# Patient Record
Sex: Male | Born: 1985 | Race: White | Hispanic: No | Marital: Married | Smoking: Never smoker
Health system: Southern US, Community
[De-identification: ages and names within clinical notes are randomized; demographics above are authoritative.]

## PROBLEM LIST (undated history)

## (undated) DIAGNOSIS — I10 Essential (primary) hypertension: Secondary | ICD-10-CM

## (undated) DIAGNOSIS — M109 Gout, unspecified: Secondary | ICD-10-CM

## (undated) HISTORY — PX: OTHER SURGICAL HISTORY: SHX169

---

## 2010-09-04 ENCOUNTER — Ambulatory Visit: Payer: Self-pay | Admitting: Family Medicine

## 2012-07-16 ENCOUNTER — Ambulatory Visit: Payer: Self-pay | Admitting: Family Medicine

## 2012-08-10 ENCOUNTER — Ambulatory Visit: Payer: Self-pay | Admitting: Family Medicine

## 2013-12-30 ENCOUNTER — Ambulatory Visit: Payer: Self-pay | Admitting: Family Medicine

## 2014-01-10 ENCOUNTER — Emergency Department (HOSPITAL_COMMUNITY)
Admission: EM | Admit: 2014-01-10 | Discharge: 2014-01-10 | Disposition: A | Discharge status: Home / Self Care | Payer: 59 | Source: Home / Self Care | Attending: Family Medicine | Admitting: Family Medicine

## 2014-01-10 ENCOUNTER — Encounter (HOSPITAL_COMMUNITY): Payer: Self-pay | Admitting: *Deleted

## 2014-01-10 ENCOUNTER — Ambulatory Visit: Payer: Self-pay | Admitting: Family Medicine

## 2014-01-10 DIAGNOSIS — M10071 Idiopathic gout, right ankle and foot: Secondary | ICD-10-CM

## 2014-01-10 HISTORY — DX: Essential (primary) hypertension: I10

## 2014-01-10 HISTORY — DX: Gout, unspecified: M10.9

## 2014-01-10 LAB — URIC ACID: URIC ACID, SERUM: 7.5 mg/dL (ref 4.0–7.8)

## 2014-01-10 MED ORDER — INDOMETHACIN 50 MG PO CAPS: 50.0000 mg | ORAL_CAPSULE | Freq: Three times a day (TID) | ORAL | Status: DC

## 2014-01-10 MED ORDER — METHYLPREDNISOLONE ACETATE 80 MG/ML IJ SUSP
INTRAMUSCULAR | Status: AC
  Filled 2014-01-10: qty 1

## 2014-01-10 MED ORDER — METHYLPREDNISOLONE ACETATE PF 80 MG/ML IJ SUSP
80.0000 mg | Freq: Once | INTRAMUSCULAR | Status: AC
  Administered 2014-01-10: 80 mg via INTRAMUSCULAR

## 2014-01-10 MED ORDER — COLCHICINE 0.6 MG PO TABS: 0.6000 mg | ORAL_TABLET | Freq: Two times a day (BID) | ORAL | Status: AC

## 2014-01-10 NOTE — Discharge Instructions (Signed)
Use medicine as prescribed, have your doctor check blood test result, see your doctor as planned.

## 2014-01-10 NOTE — ED Provider Notes (Signed)
CSN: [id number]     Arrival date & time 01/10/14  1047 History   First MD Initiated Contact with Patient 01/10/14 1102     Chief Complaint  Patient presents with  . Foot Pain   (Consider location/radiation/quality/duration/timing/severity/associated sxs/prior Treatment) Patient is a 29 y.o. male presenting with lower extremity pain. The history is provided by the patient.  Foot Pain This is a chronic problem. The current episode started yesterday (longstanding gout hx, only taking uloric.). The problem has been gradually worsening. Pertinent negatives include no chest pain and no abdominal pain.    Past Medical History  Diagnosis Date  . Gout   . Hypertension    History reviewed. No pertinent past surgical history. History reviewed. No pertinent family history. History  Substance Use Topics  . Smoking status: Never Smoker   . Smokeless tobacco: Not on file  . Alcohol Use: No    Review of Systems  Constitutional: Negative.   Cardiovascular: Negative for chest pain.  Gastrointestinal: Negative for abdominal pain.  Musculoskeletal: Positive for joint swelling and gait problem. Negative for myalgias.    Allergies  Codeine  Home Medications   Prior to Admission medications   Medication Sig Start Date End Date Taking? Authorizing Provider  colchicine 0.6 MG tablet Take 1 tablet (0.6 mg total) by mouth 2 (two) times daily. 01/10/14   Linna Hoff, MD  indomethacin (INDOCIN) 50 MG capsule Take 1 capsule (50 mg total) by mouth 3 (three) times daily with meals. 01/10/14   Linna Hoff, MD  Meloxicam (MOBIC PO) Take by mouth.   Yes Historical Provider, MD   BP 145/93 mmHg  Pulse 96  Temp(Src) 97.7 F (36.5 C) (Oral)  Resp 16 Physical Exam  Constitutional: He is oriented to person, place, and time. He appears well-developed and well-nourished. He appears distressed.  Musculoskeletal: He exhibits tenderness.       Right ankle: He exhibits decreased range of motion, swelling and  deformity. He exhibits no ecchymosis and normal pulse. Tenderness. Lateral malleolus and medial malleolus tenderness found. No head of 5th metatarsal and no proximal fibula tenderness found. Achilles tendon normal.  Neurological: He is alert and oriented to person, place, and time.  Skin: Skin is warm and dry.  Nursing note and vitals reviewed.   ED Course  Procedures (including critical care time) Labs Review Labs Reviewed  URIC ACID    Imaging Review No results found.   MDM   1. Acute idiopathic gout of right ankle        Linna Hoff, MD 01/13/14 [phone]

## 2014-01-10 NOTE — ED Notes (Signed)
Pt  Reports  Pain  r  Foot      Since  last pm   He  Reports  History  Of   Gout  In  Past       denys  Any  specefic  Injury

## 2014-01-14 NOTE — ED Notes (Signed)
Uric acid 7.5.  Hx. Gout, treated with Colchicine and Indomethacin.  I asked Dr. Artis FlockKindl if pt. needs notified of his result.  He said no further action needed. Vassie MoselleYork, Bosco Paparella M 01/14/2014

## 2014-02-10 ENCOUNTER — Ambulatory Visit: Payer: Self-pay | Admitting: Family Medicine

## 2014-04-14 ENCOUNTER — Ambulatory Visit
Admit: 2014-04-14 | Disposition: A | Discharge status: Home / Self Care | Payer: Self-pay | Attending: Family Medicine | Admitting: Family Medicine

## 2014-05-21 ENCOUNTER — Encounter: Payer: 59 | Attending: Family Medicine | Admitting: Dietician

## 2014-05-21 ENCOUNTER — Encounter: Payer: Self-pay | Admitting: Dietician

## 2014-05-21 VITALS — Ht 67.0 in | Wt 250.7 lb

## 2014-05-21 DIAGNOSIS — E669 Obesity, unspecified: Secondary | ICD-10-CM | POA: Diagnosis present

## 2014-05-21 NOTE — Progress Notes (Signed)
Medical Nutrition Therapy: Visit time: 8-8:45am Assessment:  Diagnosis: obesity Past medical history: gout, hypertension Psychosocial issues/ stress concerns: Pt. had job change in past year as well as being new father (child just turned 1 yr.)  Current weight: 250.7  Height: 67 in Medications, supplements: see list Progress and evaluation:  Pt. In for 2nd MNT follow-up appointment. He weighs 1.2 lbs less than at previous visit, 01/2014.  He has continued with the positive diet changes he made after initial visit. He drinks no sugar sweetened beverages and controls portions especially of starchy foods. He has increased his vegetable intake which he states has been low all of his life. He is averaging 2 fruit servings/day and is emphasizing fruits high in potassium as was recommended. He is also taking a vitamin C supplement. He continues to eliminate red meats and limits sodium intake. He expressed frustration that his weight has plateaued  but agrees lack of physical activity is main factor. Pt. does report that his blood pressure has significantly improved and his uric acid has significantly decreased.  Physical activity: none. Since walking for exercise contributed to gout flare-up, suggested that [employer] contact [facility] to talk with exercise specialist about upper body exercises or equipment that would less likely put pressure on feet, knees and legs. Gave him a coupon allowing him to be assessed with no charge.  Nutrition Care Education: Gout: Reviewed dietary guidelines for gout with emphasis on a more plant based diet. Weight control: benefits of weight control, identifying healthy weight, determining reasonable weight goal, behavioral changes for weight loss Hypertension:  importance of controlling BP, identifying high sodium foods, identifying food sources of Calcium, potassium, magnesium Other lifestyle changes:  benefits of making changes, increasing motivation, readiness for  change, identifying habits that need to change, alcohol use, food and drug interactions  Nutritional Diagnosis: Low water intake based on dietary guidelines for gout. Low intake of fruits/vegetables although improved.  Intervention:  Pt. agreed to: [employer] assessment to see if upper body exercises would aggravate gout less than previously tried exercises. To use coupon for free week at fitness center. To continue to increase vegetables, trying roasting to decrease strong flavor. To be more conscious of water intake and increase to at least 10 cups per day.  Education Materials given:  Marland Kitchen. Has previously been given dietary guidelines for gout and hypertension. . Goals/ instructions  Learner/ who was taught:  . Patient   Level of understanding: Marland Kitchen. Verbalizes/ demonstrates competency  Learning barriers: . None  Willingness to learn/ readiness for change: . Eager, change in progress  Monitoring and Evaluation:  No follow-up scheduled. Pt. To call if desires further help with his diet/nutrition.

## 2014-05-21 NOTE — Patient Instructions (Signed)
Pt. Agreed to: Contact [facility] to schedule assessment to see if upper body exercises would aggravate gout less than previously tried exercises. To use coupon for free week at fitness center. To continue to increase vegetables, trying roasting to decrease strong flavor. To be more conscious of water intake and increase to at least 10 cups per day.

## 2014-10-13 ENCOUNTER — Other Ambulatory Visit: Payer: Self-pay | Admitting: Family Medicine

## 2015-12-22 DIAGNOSIS — Z885 Allergy status to narcotic agent status: Secondary | ICD-10-CM | POA: Diagnosis not present

## 2015-12-22 DIAGNOSIS — M545 Low back pain: Secondary | ICD-10-CM | POA: Diagnosis not present

## 2015-12-22 DIAGNOSIS — Z791 Long term (current) use of non-steroidal anti-inflammatories (NSAID): Secondary | ICD-10-CM | POA: Diagnosis not present

## 2015-12-22 DIAGNOSIS — Z6841 Body Mass Index (BMI) 40.0 and over, adult: Secondary | ICD-10-CM | POA: Diagnosis not present

## 2015-12-22 DIAGNOSIS — Z79899 Other long term (current) drug therapy: Secondary | ICD-10-CM | POA: Diagnosis not present

## 2015-12-22 DIAGNOSIS — Z7952 Long term (current) use of systemic steroids: Secondary | ICD-10-CM | POA: Diagnosis not present

## 2015-12-22 DIAGNOSIS — M1A09X1 Idiopathic chronic gout, multiple sites, with tophus (tophi): Secondary | ICD-10-CM | POA: Diagnosis not present

## 2015-12-22 DIAGNOSIS — I10 Essential (primary) hypertension: Secondary | ICD-10-CM | POA: Diagnosis not present

## 2016-02-18 DIAGNOSIS — M1A00X1 Idiopathic chronic gout, unspecified site, with tophus (tophi): Secondary | ICD-10-CM | POA: Diagnosis not present

## 2016-02-18 DIAGNOSIS — M25461 Effusion, right knee: Secondary | ICD-10-CM | POA: Diagnosis not present

## 2016-02-25 DIAGNOSIS — M2550 Pain in unspecified joint: Secondary | ICD-10-CM | POA: Diagnosis not present

## 2016-02-25 DIAGNOSIS — Z7952 Long term (current) use of systemic steroids: Secondary | ICD-10-CM | POA: Diagnosis not present

## 2016-02-25 DIAGNOSIS — M10072 Idiopathic gout, left ankle and foot: Secondary | ICD-10-CM | POA: Diagnosis not present

## 2016-02-25 DIAGNOSIS — E559 Vitamin D deficiency, unspecified: Secondary | ICD-10-CM | POA: Diagnosis not present

## 2016-02-25 DIAGNOSIS — I10 Essential (primary) hypertension: Secondary | ICD-10-CM | POA: Diagnosis not present

## 2016-02-25 DIAGNOSIS — M1A09X1 Idiopathic chronic gout, multiple sites, with tophus (tophi): Secondary | ICD-10-CM | POA: Diagnosis not present

## 2016-04-20 DIAGNOSIS — I10 Essential (primary) hypertension: Secondary | ICD-10-CM | POA: Diagnosis not present

## 2016-04-20 DIAGNOSIS — R195 Other fecal abnormalities: Secondary | ICD-10-CM | POA: Diagnosis not present

## 2016-04-20 DIAGNOSIS — T504X5A Adverse effect of drugs affecting uric acid metabolism, initial encounter: Secondary | ICD-10-CM | POA: Diagnosis not present

## 2016-04-20 DIAGNOSIS — Z885 Allergy status to narcotic agent status: Secondary | ICD-10-CM | POA: Diagnosis not present

## 2016-04-20 DIAGNOSIS — M1A09X1 Idiopathic chronic gout, multiple sites, with tophus (tophi): Secondary | ICD-10-CM | POA: Diagnosis not present

## 2016-04-20 DIAGNOSIS — Z09 Encounter for follow-up examination after completed treatment for conditions other than malignant neoplasm: Secondary | ICD-10-CM | POA: Diagnosis not present

## 2016-04-20 DIAGNOSIS — R109 Unspecified abdominal pain: Secondary | ICD-10-CM | POA: Diagnosis not present

## 2016-04-20 DIAGNOSIS — Z6841 Body Mass Index (BMI) 40.0 and over, adult: Secondary | ICD-10-CM | POA: Diagnosis not present

## 2016-04-20 DIAGNOSIS — Z79899 Other long term (current) drug therapy: Secondary | ICD-10-CM | POA: Diagnosis not present

## 2016-09-16 ENCOUNTER — Ambulatory Visit (INDEPENDENT_AMBULATORY_CARE_PROVIDER_SITE_OTHER): Payer: Self-pay | Admitting: Family

## 2016-09-16 DIAGNOSIS — Z79899 Other long term (current) drug therapy: Secondary | ICD-10-CM | POA: Diagnosis not present

## 2016-09-16 DIAGNOSIS — E559 Vitamin D deficiency, unspecified: Secondary | ICD-10-CM | POA: Diagnosis not present

## 2016-09-16 DIAGNOSIS — M10062 Idiopathic gout, left knee: Secondary | ICD-10-CM | POA: Diagnosis not present

## 2016-09-16 DIAGNOSIS — Z6841 Body Mass Index (BMI) 40.0 and over, adult: Secondary | ICD-10-CM | POA: Diagnosis not present

## 2016-09-16 DIAGNOSIS — M1A09X1 Idiopathic chronic gout, multiple sites, with tophus (tophi): Secondary | ICD-10-CM | POA: Diagnosis not present

## 2016-09-16 DIAGNOSIS — M25462 Effusion, left knee: Secondary | ICD-10-CM | POA: Diagnosis not present

## 2016-09-16 DIAGNOSIS — I10 Essential (primary) hypertension: Secondary | ICD-10-CM | POA: Diagnosis not present

## 2016-09-16 DIAGNOSIS — Z5181 Encounter for therapeutic drug level monitoring: Secondary | ICD-10-CM | POA: Diagnosis not present

## 2016-10-28 DIAGNOSIS — Z23 Encounter for immunization: Secondary | ICD-10-CM | POA: Diagnosis not present

## 2016-12-05 ENCOUNTER — Emergency Department: Payer: BLUE CROSS/BLUE SHIELD

## 2016-12-05 ENCOUNTER — Encounter: Payer: Self-pay | Admitting: Emergency Medicine

## 2016-12-05 DIAGNOSIS — I10 Essential (primary) hypertension: Secondary | ICD-10-CM | POA: Diagnosis present

## 2016-12-05 DIAGNOSIS — X500XXA Overexertion from strenuous movement or load, initial encounter: Secondary | ICD-10-CM | POA: Diagnosis not present

## 2016-12-05 DIAGNOSIS — Z885 Allergy status to narcotic agent status: Secondary | ICD-10-CM | POA: Diagnosis not present

## 2016-12-05 DIAGNOSIS — A419 Sepsis, unspecified organism: Principal | ICD-10-CM | POA: Diagnosis present

## 2016-12-05 DIAGNOSIS — M109 Gout, unspecified: Secondary | ICD-10-CM | POA: Diagnosis not present

## 2016-12-05 DIAGNOSIS — M7989 Other specified soft tissue disorders: Secondary | ICD-10-CM | POA: Diagnosis not present

## 2016-12-05 DIAGNOSIS — S63501A Unspecified sprain of right wrist, initial encounter: Secondary | ICD-10-CM | POA: Diagnosis present

## 2016-12-05 DIAGNOSIS — R220 Localized swelling, mass and lump, head: Secondary | ICD-10-CM | POA: Diagnosis not present

## 2016-12-05 DIAGNOSIS — E876 Hypokalemia: Secondary | ICD-10-CM | POA: Diagnosis present

## 2016-12-05 DIAGNOSIS — L03211 Cellulitis of face: Secondary | ICD-10-CM | POA: Diagnosis not present

## 2016-12-05 DIAGNOSIS — M25531 Pain in right wrist: Secondary | ICD-10-CM | POA: Diagnosis not present

## 2016-12-05 DIAGNOSIS — Z79899 Other long term (current) drug therapy: Secondary | ICD-10-CM | POA: Diagnosis not present

## 2016-12-05 DIAGNOSIS — S52514A Nondisplaced fracture of right radial styloid process, initial encounter for closed fracture: Secondary | ICD-10-CM | POA: Diagnosis not present

## 2016-12-05 DIAGNOSIS — S6991XA Unspecified injury of right wrist, hand and finger(s), initial encounter: Secondary | ICD-10-CM | POA: Diagnosis not present

## 2016-12-05 LAB — CBC
HCT: 43 % (ref 40.0–52.0)
Hemoglobin: 15 g/dL (ref 13.0–18.0)
MCH: 30.7 pg (ref 26.0–34.0)
MCHC: 34.8 g/dL (ref 32.0–36.0)
MCV: 88 fL (ref 80.0–100.0)
Platelets: 311 10*3/uL (ref 150–440)
RBC: 4.88 MIL/uL (ref 4.40–5.90)
RDW: 13.2 % (ref 11.5–14.5)
WBC: 13.2 10*3/uL — ABNORMAL HIGH (ref 3.8–10.6)

## 2016-12-05 LAB — BASIC METABOLIC PANEL
ANION GAP: 12 (ref 5–15)
BUN: 13 mg/dL (ref 6–20)
CHLORIDE: 102 mmol/L (ref 101–111)
CO2: 24 mmol/L (ref 22–32)
Calcium: 8.9 mg/dL (ref 8.9–10.3)
Creatinine, Ser: 1.1 mg/dL (ref 0.61–1.24)
GFR calc Af Amer: >60 mL/min (ref >60)
GFR calc non Af Amer: >60 mL/min (ref >60)
GLUCOSE: 116 mg/dL — AB (ref 65–99)
Potassium: 3.4 mmol/L — ABNORMAL LOW (ref 3.5–5.1)
Sodium: 138 mmol/L (ref 135–145)

## 2016-12-05 MED ORDER — IOPAMIDOL (ISOVUE-300) INJECTION 61%
75.0000 mL | Freq: Once | INTRAVENOUS | Status: AC | PRN
  Administered 2016-12-05: 75 mL via INTRAVENOUS

## 2016-12-05 MED ORDER — SODIUM CHLORIDE 0.9 % IV BOLUS (SEPSIS)
1000.0000 mL | Freq: Once | INTRAVENOUS | Status: AC
  Administered 2016-12-05: 1000 mL via INTRAVENOUS

## 2016-12-05 NOTE — ED Notes (Addendum)
Discussed with Dr. Sharma CovertNorman pt's presentation received verbal orders for blood work CBC, BM  and CT of face with contrast and 1L NS, acknowledge orders

## 2016-12-05 NOTE — ED Triage Notes (Signed)
Pt reports had a "pimple" to left nostril reports mild pain Saturday and redness and swelling started to spread to left side of face, pt reports today swelling has increased reports tenderness upon palpation. Denies any difficulty swallowing talks in complete sentences

## 2016-12-06 ENCOUNTER — Other Ambulatory Visit: Payer: Self-pay

## 2016-12-06 ENCOUNTER — Inpatient Hospital Stay
Admission: EM | Admit: 2016-12-06 | Discharge: 2016-12-07 | DRG: 872 | Disposition: A | Discharge status: Home / Self Care | Payer: BLUE CROSS/BLUE SHIELD | Attending: Internal Medicine | Admitting: Internal Medicine

## 2016-12-06 ENCOUNTER — Encounter: Payer: Self-pay | Admitting: Internal Medicine

## 2016-12-06 ENCOUNTER — Inpatient Hospital Stay: Payer: BLUE CROSS/BLUE SHIELD

## 2016-12-06 DIAGNOSIS — M7989 Other specified soft tissue disorders: Secondary | ICD-10-CM | POA: Diagnosis not present

## 2016-12-06 DIAGNOSIS — E876 Hypokalemia: Secondary | ICD-10-CM | POA: Diagnosis present

## 2016-12-06 DIAGNOSIS — M25531 Pain in right wrist: Secondary | ICD-10-CM | POA: Diagnosis not present

## 2016-12-06 DIAGNOSIS — X500XXA Overexertion from strenuous movement or load, initial encounter: Secondary | ICD-10-CM | POA: Diagnosis not present

## 2016-12-06 DIAGNOSIS — L03211 Cellulitis of face: Secondary | ICD-10-CM | POA: Diagnosis not present

## 2016-12-06 DIAGNOSIS — A419 Sepsis, unspecified organism: Secondary | ICD-10-CM | POA: Diagnosis present

## 2016-12-06 DIAGNOSIS — I10 Essential (primary) hypertension: Secondary | ICD-10-CM | POA: Diagnosis not present

## 2016-12-06 DIAGNOSIS — Z79899 Other long term (current) drug therapy: Secondary | ICD-10-CM | POA: Diagnosis not present

## 2016-12-06 DIAGNOSIS — Z885 Allergy status to narcotic agent status: Secondary | ICD-10-CM | POA: Diagnosis not present

## 2016-12-06 DIAGNOSIS — M109 Gout, unspecified: Secondary | ICD-10-CM | POA: Diagnosis not present

## 2016-12-06 DIAGNOSIS — S63501A Unspecified sprain of right wrist, initial encounter: Secondary | ICD-10-CM | POA: Diagnosis not present

## 2016-12-06 LAB — BASIC METABOLIC PANEL
Anion gap: 10 (ref 5–15)
BUN: 9 mg/dL (ref 6–20)
CHLORIDE: 104 mmol/L (ref 101–111)
CO2: 22 mmol/L (ref 22–32)
Calcium: 8.8 mg/dL — ABNORMAL LOW (ref 8.9–10.3)
Creatinine, Ser: 0.98 mg/dL (ref 0.61–1.24)
Glucose, Bld: 85 mg/dL (ref 65–99)
POTASSIUM: 3.6 mmol/L (ref 3.5–5.1)
SODIUM: 136 mmol/L (ref 135–145)

## 2016-12-06 LAB — CBC
HEMATOCRIT: 42 % (ref 40.0–52.0)
Hemoglobin: 14.5 g/dL (ref 13.0–18.0)
MCH: 30.4 pg (ref 26.0–34.0)
MCHC: 34.5 g/dL (ref 32.0–36.0)
MCV: 88.1 fL (ref 80.0–100.0)
PLATELETS: 275 10*3/uL (ref 150–440)
RBC: 4.77 MIL/uL (ref 4.40–5.90)
RDW: 13.1 % (ref 11.5–14.5)
WBC: 12.6 10*3/uL — AB (ref 3.8–10.6)

## 2016-12-06 MED ORDER — ACETAMINOPHEN 325 MG PO TABS: 650.0000 mg | ORAL_TABLET | Freq: Four times a day (QID) | ORAL | Status: DC | PRN

## 2016-12-06 MED ORDER — POTASSIUM CHLORIDE CRYS ER 20 MEQ PO TBCR
20.0000 meq | EXTENDED_RELEASE_TABLET | Freq: Once | ORAL | Status: AC
  Administered 2016-12-06: 20 meq via ORAL
  Filled 2016-12-06: qty 1

## 2016-12-06 MED ORDER — VANCOMYCIN HCL IN DEXTROSE 1-5 GM/200ML-% IV SOLN: 1000.0000 mg | Freq: Once | INTRAVENOUS | Status: DC

## 2016-12-06 MED ORDER — TRAMADOL HCL 50 MG PO TABS
50.0000 mg | ORAL_TABLET | Freq: Four times a day (QID) | ORAL | Status: DC | PRN
Start: 2016-12-06 — End: 2016-12-06
  Administered 2016-12-06 (×2): 50 mg via ORAL
  Filled 2016-12-06 (×2): qty 1

## 2016-12-06 MED ORDER — LOSARTAN POTASSIUM 50 MG PO TABS
50.0000 mg | ORAL_TABLET | Freq: Every day | ORAL | Status: DC
  Administered 2016-12-07: 10:00:00 50 mg via ORAL
  Filled 2016-12-06 (×2): qty 1

## 2016-12-06 MED ORDER — ENOXAPARIN SODIUM 40 MG/0.4ML ~~LOC~~ SOLN
40.0000 mg | Freq: Two times a day (BID) | SUBCUTANEOUS | Status: DC
  Administered 2016-12-06 – 2016-12-07 (×3): 40 mg via SUBCUTANEOUS
  Filled 2016-12-06 (×3): qty 0.4

## 2016-12-06 MED ORDER — ONDANSETRON HCL 4 MG/2ML IJ SOLN: 4.0000 mg | Freq: Four times a day (QID) | INTRAMUSCULAR | Status: DC | PRN

## 2016-12-06 MED ORDER — COLCHICINE 0.6 MG PO TABS
0.6000 mg | ORAL_TABLET | Freq: Two times a day (BID) | ORAL | Status: DC
  Administered 2016-12-06 – 2016-12-07 (×3): 0.6 mg via ORAL
  Filled 2016-12-06 (×3): qty 1

## 2016-12-06 MED ORDER — VANCOMYCIN HCL 10 G IV SOLR
1250.0000 mg | Freq: Three times a day (TID) | INTRAVENOUS | Status: DC
  Administered 2016-12-06: 07:00:00 1250 mg via INTRAVENOUS
  Filled 2016-12-06: qty 1250

## 2016-12-06 MED ORDER — FEBUXOSTAT 40 MG PO TABS
120.0000 mg | ORAL_TABLET | Freq: Every day | ORAL | Status: DC
  Administered 2016-12-06 – 2016-12-07 (×2): 120 mg via ORAL
  Filled 2016-12-06 (×2): qty 3

## 2016-12-06 MED ORDER — SODIUM CHLORIDE 0.9 % IV SOLN
3.0000 g | Freq: Four times a day (QID) | INTRAVENOUS | Status: DC
  Administered 2016-12-06 – 2016-12-07 (×5): 3 g via INTRAVENOUS
  Filled 2016-12-06 (×7): qty 3

## 2016-12-06 MED ORDER — MORPHINE SULFATE (PF) 2 MG/ML IV SOLN
2.0000 mg | INTRAVENOUS | Status: DC | PRN
  Administered 2016-12-06 – 2016-12-07 (×5): 2 mg via INTRAVENOUS
  Filled 2016-12-06 (×5): qty 1

## 2016-12-06 MED ORDER — LOSARTAN POTASSIUM 50 MG PO TABS
25.0000 mg | ORAL_TABLET | Freq: Every day | ORAL | Status: DC
  Administered 2016-12-06: 25 mg via ORAL
  Filled 2016-12-06: qty 1

## 2016-12-06 MED ORDER — ONDANSETRON HCL 4 MG PO TABS: 4.0000 mg | ORAL_TABLET | Freq: Four times a day (QID) | ORAL | Status: DC | PRN

## 2016-12-06 MED ORDER — FEBUXOSTAT 40 MG PO TABS
80.0000 mg | ORAL_TABLET | Freq: Every day | ORAL | Status: DC
  Filled 2016-12-06: qty 2

## 2016-12-06 MED ORDER — SODIUM CHLORIDE 0.9 % IV SOLN
INTRAVENOUS | Status: DC
  Administered 2016-12-06 (×2): via INTRAVENOUS

## 2016-12-06 MED ORDER — SENNOSIDES-DOCUSATE SODIUM 8.6-50 MG PO TABS: 1.0000 | ORAL_TABLET | Freq: Every evening | ORAL | Status: DC | PRN

## 2016-12-06 MED ORDER — OXYCODONE-ACETAMINOPHEN 5-325 MG PO TABS
1.0000 | ORAL_TABLET | Freq: Four times a day (QID) | ORAL | Status: DC | PRN
  Administered 2016-12-06 (×2): 1 via ORAL
  Administered 2016-12-07: 2 via ORAL
  Filled 2016-12-06 (×4): qty 1

## 2016-12-06 MED ORDER — VITAMIN D (ERGOCALCIFEROL) 1.25 MG (50000 UNIT) PO CAPS
50000.0000 [IU] | ORAL_CAPSULE | ORAL | Status: DC
  Administered 2016-12-06: 09:00:00 50000 [IU] via ORAL
  Filled 2016-12-06: qty 1

## 2016-12-06 MED ORDER — ACETAMINOPHEN 650 MG RE SUPP: 650.0000 mg | Freq: Four times a day (QID) | RECTAL | Status: DC | PRN

## 2016-12-06 MED ORDER — VANCOMYCIN HCL IN DEXTROSE 1-5 GM/200ML-% IV SOLN
1000.0000 mg | Freq: Once | INTRAVENOUS | Status: AC
  Administered 2016-12-06: 1000 mg via INTRAVENOUS
  Filled 2016-12-06: qty 200

## 2016-12-06 NOTE — ED Notes (Signed)
ED Provider at bedside. 

## 2016-12-06 NOTE — H&P (Signed)
[facility] [place] [facility]   PATIENT NAME: Mike Vincent    MR#:  [id number]  DATE OF BIRTH:  [birth year]  DATE OF ADMISSION:  12/06/2016  PRIMARY CARE PHYSICIAN: Selinda Flavin, MD   REQUESTING/REFERRING PHYSICIAN:   CHIEF COMPLAINT:   Chief Complaint  Patient presents with  . Facial Swelling    HISTORY OF PRESENT ILLNESS: Mike Vincent  is a 31 y.o. male with a known history of hypertension, gout presented to the emergency room with left facial swelling and redness. Initially it started as small pimple on the nose which was bursted 2 days ago. Later on patient developed redness and swelling in the left facial area extending up to the infraorbital area in the left side. There is pain and fullness in the left facial area. The pain is aching in nature 6 out of 10 on a scale of 1-10. No fever and chills. Patient tried oral Augmentin antibiotic. Patient was worked up with CT maxillofacial area which showed a diffuse cellulitis of the left side of the face but no abscess. Patient was started on IV vancomycin antibiotic. Hospitalist service was consulted.  PAST MEDICAL HISTORY:   Past Medical History:  Diagnosis Date  . Gout   . Hypertension     PAST SURGICAL HISTORY:  Past Surgical History:  Procedure Laterality Date  . none      SOCIAL HISTORY:  Social History   Tobacco Use  . Smoking status: Never Smoker  . Smokeless tobacco: Never Used  Substance Use Topics  . Alcohol use: No    Alcohol/week: 0.0 oz    Frequency: Never    FAMILY HISTORY:  Family History  Problem Relation Age of Onset  . Diabetes Mellitus II Father   . Hypertension Father     DRUG ALLERGIES:  Allergies  Allergen Reactions  . Codeine     REVIEW OF SYSTEMS:   CONSTITUTIONAL: No fever, fatigue or weakness.  EYES: No blurred or double vision.  EARS, NOSE, AND THROAT: No tinnitus or ear pain.  RESPIRATORY: No cough, shortness of breath, wheezing or hemoptysis.   CARDIOVASCULAR: No chest pain, orthopnea, edema.  GASTROINTESTINAL: No nausea, vomiting, diarrhea or abdominal pain.  GENITOURINARY: No dysuria, hematuria.  ENDOCRINE: No polyuria, nocturia,  HEMATOLOGY: No anemia, easy bruising or bleeding SKIN: Redness of skin left facial area Tenderness left facial area MUSCULOSKELETAL: No joint pain or arthritis.   NEUROLOGIC: No tingling, numbness, weakness.  PSYCHIATRY: No anxiety or depression.   MEDICATIONS AT HOME:  Prior to Admission medications   Medication Sig Start Date End Date Taking? Authorizing Provider  amoxicillin-clavulanate (AUGMENTIN) 875-125 MG tablet Take 1 tablet by mouth 2 (two) times daily. 12/05/16  Yes [provider]  traMADol (ULTRAM) 50 MG tablet Take 1 tablet by mouth every 6 (six) hours as needed. 12/05/16  Yes [provider]  Vitamin D, Ergocalciferol, (DRISDOL) 50000 units CAPS capsule Take 1 capsule by mouth once a week. For 12 weeks 09/26/16  Yes [provider]  colchicine 0.6 MG tablet Take 1 tablet (0.6 mg total) by mouth 2 (two) times daily. 01/10/14   Linna Hoff, MD  febuxostat (ULORIC) 40 MG tablet Take 80 mg by mouth daily.    [provider]  losartan (COZAAR) 50 MG tablet  05/10/14   [provider]  Meloxicam (MOBIC PO) Take by mouth.    [provider]      PHYSICAL EXAMINATION:   VITAL SIGNS: Blood pressure Marland Kitchen)  165/107, pulse (!) 110, temperature 98.7 F (37.1 C), temperature source Oral, resp. rate 20, height 5\' 7"  (1.702 m), weight 119.3 kg (263 lb), SpO2 95 %.  GENERAL:  31 y.o.-year-old patient lying in the bed with no acute distress.  EYES: Pupils equal, round, reactive to light and accommodation. No scleral icterus. Extraocular muscles intact.  HEENT: Head atraumatic, normocephalic. Oropharynx and nasopharynx clear.  Left side of face swollen from infra orbital area to nasal fold Tenderness present NECK:  Supple, no jugular venous  distention. No thyroid enlargement, no tenderness.  LUNGS: Normal breath sounds bilaterally, no wheezing, rales,rhonchi or crepitation. No use of accessory muscles of respiration.  CARDIOVASCULAR: S1, S2 normal. No murmurs, rubs, or gallops.  ABDOMEN: Soft, nontender, nondistended. Bowel sounds present. No organomegaly or mass.  EXTREMITIES: No pedal edema, cyanosis, or clubbing.  NEUROLOGIC: Cranial nerves II through XII are intact. Muscle strength 5/5 in all extremities. Sensation intact. Gait not checked.  PSYCHIATRIC: The patient is alert and oriented x 3.  SKIN: Redness of skin left facial area  LABORATORY PANEL:   CBC Recent Labs  Lab 12/05/16 2239  WBC 13.2*  HGB 15.0  HCT 43.0  PLT 311  MCV 88.0  MCH 30.7  MCHC 34.8  RDW 13.2   ------------------------------------------------------------------------------------------------------------------  Chemistries  Recent Labs  Lab 12/05/16 2239  NA 138  K 3.4*  CL 102  CO2 24  GLUCOSE 116*  BUN 13  CREATININE 1.10  CALCIUM 8.9   ------------------------------------------------------------------------------------------------------------------ estimated creatinine clearance is 118.1 mL/min (by C-G formula based on SCr of 1.1 mg/dL). ------------------------------------------------------------------------------------------------------------------ No results for input(s): TSH, T4TOTAL, T3FREE, THYROIDAB in the last 72 hours.  Invalid input(s): FREET3   Coagulation profile No results for input(s): INR, PROTIME in the last 168 hours. ------------------------------------------------------------------------------------------------------------------- No results for input(s): DDIMER in the last 72 hours. -------------------------------------------------------------------------------------------------------------------  Cardiac Enzymes No results for input(s): CKMB, TROPONINI, MYOGLOBIN in the last 168 hours.  Invalid  input(s): CK ------------------------------------------------------------------------------------------------------------------ Invalid input(s): POCBNP  ---------------------------------------------------------------------------------------------------------------  Urinalysis No results found for: COLORURINE, APPEARANCEUR, LABSPEC, PHURINE, GLUCOSEU, HGBUR, BILIRUBINUR, KETONESUR, PROTEINUR, UROBILINOGEN, NITRITE, LEUKOCYTESUR   RADIOLOGY: Ct Maxillofacial W Contrast  Result Date: 12/06/2016 CLINICAL DATA:  Left facial swelling and pain EXAM: CT MAXILLOFACIAL WITH CONTRAST TECHNIQUE: Multidetector CT imaging of the maxillofacial structures was performed with intravenous contrast. Multiplanar CT image reconstructions were also generated. CONTRAST:  75mL ISOVUE-300 IOPAMIDOL (ISOVUE-300) INJECTION 61% COMPARISON:  None. FINDINGS: Osseous:  No facial fracture or focal osseous lesion. Orbits: The globes appear intact. Normal appearance of the intra- and extraconal fat. Symmetric extraocular muscles. Sinuses: No fluid levels or advanced mucosal thickening. Soft tissues: There is focal soft tissue swelling within the superficial soft tissues of the left nasolabial fold. No fluid collection or abscess. There is also moderate soft tissue swelling of the left infraorbital the other visible soft tissues of the face and neck are normal. Soft tissues. Limited intracranial: Normal. IMPRESSION: 1. Left facial cellulitis with inflammatory change of the skin and subcutaneous tissues of the left nasolabial fold and left infraorbital region. No abscess or drainable fluid collection. 2. No inflammatory extension into the orbit. Electronically Signed   By: Deatra RobinsonKevin  Herman M.D.   On: 12/06/2016 00:13    EKG: No orders found for this or any previous visit.  IMPRESSION AND PLAN: 31 year old male patient with history of gout, hypertension presented to the emergency room with swelling, redness and pain in the left side  of the face.  Admitting diagnosis 1. Left facial cellulitis  2. Left facial pain 3. Hypertension 4. Gout 5. Hypokalemia Treatment plan Admit patient to medical floor IV fluids Start patient on IV vancomycin antibiotic Pain management oral tramadol Monitor electrolytes Replace potassium  All the records are reviewed and case discussed with ED provider. Management plans discussed with the patient, family and they are in agreement.  CODE STATUS:FULL CODE Code Status History    This patient does not have a recorded code status. Please follow your organizational policy for patients in this situation.       TOTAL TIME TAKING CARE OF THIS PATIENT: 52 minutes.    Ihor AustinPavan Pyreddy M.D on 12/06/2016 at 2:35 AM  Between 7am to 6pm - Pager - [phone]  After 6pm go to [email or web address] - password [email or web address] [facility]  [facility] [place] [facility]  Office  [phone]  CC: Primary care physician; Selinda FlavinHoward, Kevin, MD

## 2016-12-06 NOTE — Progress Notes (Signed)
Lovenox changed to 40 mg BID for BMI >40 and CrCl >30. 

## 2016-12-06 NOTE — Consult Note (Signed)
ORTHOPAEDIC CONSULTATION  REQUESTING PHYSICIAN: Adrian SaranMody, Sital, MD  Chief Complaint:   Right wrist pain.  History of Present Illness: Mike Vincent is a 31 y.o. male with a history of gout and hypertension who lives at home with his family. Apparently, while playing with his daughter on the floor 4 days ago, he pushed off on his right arm to get up and felt a pop or shifting sensation in his wrist. Over the next 24 hours, his symptoms worsened so he went to a local urgent care facility and was placed into a Velcro wrist splint, which he has been wearing regularly, although he does note that it is "irritating". The patient developed a cellulitis of his face yesterday and was admitted for IV antibiotics. Because of the patient's complaints of wrist pain, it was elected to pursue an orthopedic consultation.  Past Medical History:  Diagnosis Date  . Gout   . Hypertension    Past Surgical History:  Procedure Laterality Date  . none     Social History   Socioeconomic History  . Marital status: Married    Spouse name: None  . Number of children: None  . Years of education: None  . Highest education level: None  Social Needs  . Financial resource strain: None  . Food insecurity - worry: None  . Food insecurity - inability: None  . Transportation needs - medical: None  . Transportation needs - non-medical: None  Occupational History    Employer: [employer]  Tobacco Use  . Smoking status: Never Smoker  . Smokeless tobacco: Never Used  Substance and Sexual Activity  . Alcohol use: No    Alcohol/week: 0.0 oz    Frequency: Never  . Drug use: No  . Sexual activity: None  Other Topics Concern  . None  Social History Narrative  . None   Family History  Problem Relation Age of Onset  . Diabetes Mellitus II Father   . Hypertension Father    Allergies  Allergen Reactions  . Codeine    Prior to Admission medications    Medication Sig Start Date End Date Taking? Authorizing Provider  amoxicillin-clavulanate (AUGMENTIN) 875-125 MG tablet Take 1 tablet by mouth 2 (two) times daily. 12/05/16  Yes [provider]  colchicine 0.6 MG tablet Take 1 tablet (0.6 mg total) by mouth 2 (two) times daily. 01/10/14  Yes Linna HoffKindl, James D, MD  Febuxostat 80 MG TABS Take 120 mg by mouth daily.    Yes [provider]  indomethacin (INDOCIN) 50 MG capsule Take 50 mg by mouth daily. 01/10/14  Yes [provider]  losartan (COZAAR) 50 MG tablet  05/10/14  Yes [provider]  Meloxicam (MOBIC PO) Take by mouth.   Yes [provider]  traMADol (ULTRAM) 50 MG tablet Take 1 tablet by mouth every 6 (six) hours as needed. 12/05/16  Yes [provider]  Vitamin D, Ergocalciferol, (DRISDOL) 50000 units CAPS capsule Take 1 capsule by mouth once a week. For 12 weeks 09/26/16  Yes [provider]   Dg Wrist 2 Views Right  Result Date: 12/06/2016 CLINICAL DATA:  Right wrist pain after wrist gave out yesterday while playing with daughter. EXAM: RIGHT WRIST - 2 VIEW COMPARISON:  None. FINDINGS: There is no evidence of fracture or dislocation. There is no evidence of arthropathy or other focal bone abnormality. Mild soft tissue swelling about the wrist. IMPRESSION: Mild soft tissue swelling about the wrist. No acute osseous abnormality. Electronically Signed  By: Obie DredgeWilliam T Derry M.D.   On: 12/06/2016 14:08   Ct Maxillofacial W Contrast  Result Date: 12/06/2016 CLINICAL DATA:  Left facial swelling and pain EXAM: CT MAXILLOFACIAL WITH CONTRAST TECHNIQUE: Multidetector CT imaging of the maxillofacial structures was performed with intravenous contrast. Multiplanar CT image reconstructions were also generated. CONTRAST:  75mL ISOVUE-300 IOPAMIDOL (ISOVUE-300) INJECTION 61% COMPARISON:  None. FINDINGS: Osseous:  No facial fracture or focal osseous lesion. Orbits: The globes appear intact. Normal  appearance of the intra- and extraconal fat. Symmetric extraocular muscles. Sinuses: No fluid levels or advanced mucosal thickening. Soft tissues: There is focal soft tissue swelling within the superficial soft tissues of the left nasolabial fold. No fluid collection or abscess. There is also moderate soft tissue swelling of the left infraorbital the other visible soft tissues of the face and neck are normal. Soft tissues. Limited intracranial: Normal. IMPRESSION: 1. Left facial cellulitis with inflammatory change of the skin and subcutaneous tissues of the left nasolabial fold and left infraorbital region. No abscess or drainable fluid collection. 2. No inflammatory extension into the orbit. Electronically Signed   By: Deatra RobinsonKevin  Herman M.D.   On: 12/06/2016 00:13    Positive ROS: All other systems have been reviewed and were otherwise negative with the exception of those mentioned in the HPI and as above.  Physical Exam: General:  Alert, no acute distress Psychiatric:  Patient is competent for consent with normal mood and affect   Cardiovascular:  No pedal edema Respiratory:  No wheezing, non-labored breathing GI:  Abdomen is soft and non-tender Skin:  No lesions in the area of chief complaint Neurologic:  Sensation intact distally Lymphatic:  No axillary or cervical lymphadenopathy  Orthopedic Exam:  Orthopedic examination is limited to the right hand and upper extremity. The patient exhibits mild swelling around the right wrist, but there is no erythema, ecchymosis, abrasions, or other skin abnormalities. There is no significant effusion. He has moderate tenderness to palpation over the dorsal and volar aspects of the wrist, and mild tenderness to palpation over the radial and ulnar aspects of the wrist. He is able to actively flex and extend his wrist to approximately 45 degrees in each direction, but has pain with attempted wrist flexion or extension beyond this point. He is able to actively flex  and extend all digits without pain or triggering. He is neurovascularly intact to all digits.  X-rays:  AP and lateral x-rays of the right wrist are available for review. These films demonstrate no evidence for fractures, lytic lesions, or significant degenerative changes.  Assessment: Acute right wrist sprain.  Plan: The treatment options are discussed with the patient. At this point, there is no indication that surgical intervention will be required. The patient is advised to wear a Velcro wrist immobilizer at all times for comfort, removing it for bathing purposes and for icing. He is to keep the hand elevated and apply ice as necessary for comfort.  Thank you for asked me to participate in the care of this most unfortunate man. I will be happy to see him back in the office in 2 weeks for re-evaluation.    Maryagnes AmosJ. Jeffrey Poggi, MD  Beeper #:  [phone]  12/06/2016 6:33 PM

## 2016-12-06 NOTE — ED Notes (Addendum)
Pt presents today with facial swelling that started Saturday.Pt states he pooped a fpimple at the nose and the swelling started, progressively worse to now. Pt denies fever, N/V/D and pain. Pt is A/O, NAD. Awaiting EDP

## 2016-12-06 NOTE — Progress Notes (Signed)
Pharmacy Antibiotic Note  Mike Vincent is a 31 y.o. male admitted on 12/06/2016 with cellulitis.  Pharmacy has been consulted for vancomycin dosing.  Plan: DW 87kg  Vd 61L kei 0.1 hr-1  T1/2 7 hours Vancomycin 1250 mg q 8 hours ordered with stacked dosing. Level before 5th dose. Goal trough 15-20.  Height: 5\' 7"  (170.2 cm) Weight: 263 lb (119.3 kg) IBW/kg (Calculated) : 66.1  Temp (24hrs), Avg:98.7 F (37.1 C), Min:98.6 F (37 C), Max:98.7 F (37.1 C)  Recent Labs  Lab 12/05/16 2239  WBC 13.2*  CREATININE 1.10    Estimated Creatinine Clearance: 118.1 mL/min (by C-G formula based on SCr of 1.1 mg/dL).    Allergies  Allergen Reactions  . Codeine     Antimicrobials this admission: Vancomycin 11/27  >>    >>   Dose adjustments this admission:   Microbiology results: 11/27 BCx: pending   Thank you for allowing pharmacy to be a part of this patient's care.  Makayela Secrest S 12/06/2016 4:23 AM

## 2016-12-06 NOTE — Progress Notes (Signed)
[facility] - [place] at [facility]   PATIENT NAME: Mike JenkinsJeremy Vincent    MR#:  [id number]  DATE OF BIRTH:  [birth year]  SUBJECTIVE:   C/o facial cellulitis and right wrist pain. Reports that he was diagnosed with the wrist fracture. Facial cellulitis/swelling has improved  REVIEW OF SYSTEMS:    Review of Systems  Constitutional: Negative for fever, chills weight loss HENT: Negative for ear pain, nosebleeds, congestion, facial swelling, rhinorrhea, neck pain, neck stiffness and ear discharge.   Respiratory: Negative for cough, shortness of breath, wheezing  Cardiovascular: Negative for chest pain, palpitations and leg swelling.  Gastrointestinal: Negative for heartburn, abdominal pain, vomiting, diarrhea or consitpation Genitourinary: Negative for dysuria, urgency, frequency, hematuria Musculoskeletal: Complaining of right wrist pain  Neurological: Negative for dizziness, seizures, syncope, focal weakness,  numbness and headaches.  Hematological: Does not bruise/bleed easily.  Psychiatric/Behavioral: Negative for hallucinations, confusion, dysphoric mood Skin: Left-sided facial cellulitis that started as a pimple   Tolerating Diet: yes      DRUG ALLERGIES:   Allergies  Allergen Reactions  . Codeine     VITALS:  Blood pressure (!) 159/69, pulse (!) 102, temperature 98.6 F (37 C), temperature source Oral, resp. rate 18, height 5\' 7"  (1.702 m), weight 119.3 kg (263 lb), SpO2 100 %.  PHYSICAL EXAMINATION:  Constitutional: Appears well-developed and well-nourished. No distress. HENT: Normocephalic. Marland Kitchen. Oropharynx is clear and moist.  Eyes: Conjunctivae and EOM are normal. PERRLA, no scleral icterus.  Neck: Normal ROM. Neck supple. No JVD. No tracheal deviation. CVS: RRR, S1/S2 +, no murmurs, no gallops, no carotid bruit.  Pulmonary: Effort and breath sounds normal, no stridor, rhonchi, wheezes, rales.  Abdominal: Soft. BS +,  no distension, tenderness, rebound or  guarding.  Musculoskeletal: Right wrist tender and swollen  Neuro: Alert. CN 2-12 grossly intact. No focal deficits. Skin: Left-sided face with edema and large area of cellulitis  Psychiatric: Normal mood and affect.      LABORATORY PANEL:   CBC Recent Labs  Lab 12/06/16 0608  WBC 12.6*  HGB 14.5  HCT 42.0  PLT 275   ------------------------------------------------------------------------------------------------------------------  Chemistries  Recent Labs  Lab 12/06/16 0608  NA 136  K 3.6  CL 104  CO2 22  GLUCOSE 85  BUN 9  CREATININE 0.98  CALCIUM 8.8*   ------------------------------------------------------------------------------------------------------------------  Cardiac Enzymes No results for input(s): TROPONINI in the last 168 hours. ------------------------------------------------------------------------------------------------------------------  RADIOLOGY:  Ct Maxillofacial W Contrast  Result Date: 12/06/2016 CLINICAL DATA:  Left facial swelling and pain EXAM: CT MAXILLOFACIAL WITH CONTRAST TECHNIQUE: Multidetector CT imaging of the maxillofacial structures was performed with intravenous contrast. Multiplanar CT image reconstructions were also generated. CONTRAST:  75mL ISOVUE-300 IOPAMIDOL (ISOVUE-300) INJECTION 61% COMPARISON:  None. FINDINGS: Osseous:  No facial fracture or focal osseous lesion. Orbits: The globes appear intact. Normal appearance of the intra- and extraconal fat. Symmetric extraocular muscles. Sinuses: No fluid levels or advanced mucosal thickening. Soft tissues: There is focal soft tissue swelling within the superficial soft tissues of the left nasolabial fold. No fluid collection or abscess. There is also moderate soft tissue swelling of the left infraorbital the other visible soft tissues of the face and neck are normal. Soft tissues. Limited intracranial: Normal. IMPRESSION: 1. Left facial cellulitis with inflammatory change of the skin  and subcutaneous tissues of the left nasolabial fold and left infraorbital region. No abscess or drainable fluid collection. 2. No inflammatory extension into the orbit. Electronically Signed   By: Chrisandra NettersKevin  Herman M.D.  On: 12/06/2016 00:13     ASSESSMENT AND PLAN:    31 year old male with history of essential hypertension who presented with left facial pain  1. Sepsis due to Left facial cellulitis: Patient presented with leukocytosis and tachycardia  I don't think patient needs vancomycin. I will start Unasyn.  2. Right wrist pain: Patient would like orthopedic surgery consultation to evaluate. He reports that this was diagnosed with a fracture  3. Essential hypertension: Continue Cozaar  4. Hypokalemia: Improved with replacement  5. History gout: Continue full session   Management plans discussed with the patient and he is in agreement.  CODE STATUS: full  TOTAL TIME TAKING CARE OF THIS PATIENT: 30 minutes.     POSSIBLE D/C 1-2 days, DEPENDING ON CLINICAL CONDITION.   Jw Covin M.D on 12/06/2016 at 11:05 AM  Between 7am to 6pm - Pager - [phone] After 6pm go to [email or web address] - Social research officer, governmentpassword EPAS [facility]  [facility] [place] [facility]  Office  [phone]  CC: Primary care physician; Selinda FlavinHoward, Kevin, MD  Note: This dictation was prepared with Dragon dictation along with smaller phrase technology. Any transcriptional errors that result from this process are unintentional.

## 2016-12-06 NOTE — ED Provider Notes (Addendum)
[facility] Emergency Department Provider Note  Time seen: 1:10 AM  I have reviewed the triage vital signs and the nursing notes.   HISTORY  Chief Complaint Facial Swelling    HPI Mike Vincent is a 31 y.o. male with a past medical history of hypertension who presents to the emergency department for left facial pain redness and swelling.  According to the patient 2 days ago he had a small pimple just below his left nostril.  States he popped it 2 days ago had some mild increase in pain and swelling yesterday however today the swelling has increased dramatically and over the past 12 hours has spread up to his eye with significant swelling around his left eye.  Denies fever.  Denies vomiting.  Patient states moderate left facial pain.  No history of cellulitis in the past.  No history of abscess in the past.  No dental pain.   Past Medical History:  Diagnosis Date  . Gout   . Hypertension     There are no active problems to display for this patient.   History reviewed. No pertinent surgical history.  Prior to Admission medications   Medication Sig Start Date End Date Taking? Authorizing Provider  colchicine 0.6 MG tablet Take 1 tablet (0.6 mg total) by mouth 2 (two) times daily. 01/10/14   Linna HoffKindl, James D, MD  febuxostat (ULORIC) 40 MG tablet Take 80 mg by mouth daily.    [provider]  indomethacin (INDOCIN) 50 MG capsule Take 1 capsule (50 mg total) by mouth 3 (three) times daily with meals. Patient not taking: Reported on 05/21/2014 01/10/14   Linna HoffKindl, James D, MD  losartan (COZAAR) 50 MG tablet  05/10/14   [provider]  Meloxicam (MOBIC PO) Take by mouth.    [provider]    Allergies  Allergen Reactions  . Codeine     No family history on file.  Social History Social History   Tobacco Use  . Smoking status: Never Smoker  . Smokeless tobacco: Never Used  Substance Use Topics  . Alcohol use: No    Alcohol/week: 0.0  oz  . Drug use: Not on file    Review of Systems Constitutional: Negative for fever. Eyes: Negative for visual changes. ENT: Left facial pain redness and swelling Cardiovascular: Negative for chest pain. Respiratory: Negative for shortness of breath. Gastrointestinal: Negative for abdominal pain, vomiting Skin: Redness to the left face Neurological: Negative for headache All other ROS negative  ____________________________________________   PHYSICAL EXAM:  VITAL SIGNS: ED Triage Vitals  Enc Vitals Group     BP 12/05/16 2231 (!) 165/107     Pulse Rate 12/05/16 2231 (!) 110     Resp 12/05/16 2231 20     Temp 12/05/16 2231 98.7 F (37.1 C)     Temp Source 12/05/16 2231 Oral     SpO2 12/05/16 2231 95 %     Weight 12/05/16 2231 263 lb (119.3 kg)     Height 12/05/16 2231 5\' 7"  (1.702 m)     Head Circumference --      Peak Flow --      Pain Score 12/05/16 2230 8     Pain Loc --      Pain Edu? --      Excl. in GC? --    Constitutional: Alert and oriented. Well appearing and in no distress. Eyes: Normal exam, denies visual changes.  EOMI.  PERRL. ENT   Head: Patient  with moderate left facial swelling and erythema with moderate tenderness.  Moderate left periorbital edema.   Mouth/Throat: Mucous membranes are moist. Cardiovascular: Regular rhythm, rate around 100 bpm.  No murmur. Respiratory: Normal respiratory effort without tachypnea nor retractions. Breath sounds are clear  Gastrointestinal: Soft and nontender. No distention.  Musculoskeletal: Nontender with normal range of motion in all extremities.  Neurologic:  Normal speech and language. No gross focal neurologic deficits Skin:  Skin is warm, dry.  Moderate erythema of left face. Psychiatric: Mood and affect are normal.   ____________________________________________   RADIOLOGY  CT scans shows left facial cellulitis without abscess.  ____________________________________________   INITIAL IMPRESSION /  ASSESSMENT AND PLAN / ED COURSE  Pertinent labs & imaging results that were available during my care of the patient were reviewed by me and considered in my medical decision making (see chart for details).  Patient presents to the emergency department for left facial redness swelling and tenderness.  Differential would include facial cellulitis, facial abscess, dental abscess.  CT scan consistent with facial cellulitis without drainable abscess.  Patient took pictures of his face throughout the day today the swelling appears to have significantly increased since this morning now involving the periorbital space.  Patient went to an urgent care this morning was started on amoxicillin which he has taken twice today but denies any improvement.  Given the quick spreading of the facial cellulitis we will start the patient on IV vancomycin and admit to the hospital for further treatment.  Patient agreeable to this plan of care.  I reviewed the patient's records which show one prior ER visit for gout, noncontributory to today's visit.  ____________________________________________   FINAL CLINICAL IMPRESSION(S) / ED DIAGNOSES  Facial cellulitis    Minna AntisPaduchowski, Skylinn Vialpando, MD 12/06/16 [id number]    Minna AntisPaduchowski, Baljit Liebert, MD 12/06/16 [phone]

## 2016-12-07 LAB — HIV ANTIBODY (ROUTINE TESTING W REFLEX): HIV SCREEN 4TH GENERATION: NONREACTIVE

## 2016-12-07 MED ORDER — OXYCODONE-ACETAMINOPHEN 5-325 MG PO TABS: 1.0000 | ORAL_TABLET | Freq: Four times a day (QID) | ORAL | 0 refills | Status: AC | PRN

## 2016-12-07 MED ORDER — SENNOSIDES-DOCUSATE SODIUM 8.6-50 MG PO TABS
1.0000 | ORAL_TABLET | Freq: Every evening | ORAL | Status: AC | PRN
Start: 2016-12-07 — End: ?

## 2016-12-07 MED ORDER — AMOXICILLIN-POT CLAVULANATE 875-125 MG PO TABS: 1.0000 | ORAL_TABLET | Freq: Two times a day (BID) | ORAL | 0 refills | Status: AC

## 2016-12-07 MED ORDER — PREDNISONE 50 MG PO TABS: 50.0000 mg | ORAL_TABLET | Freq: Every day | ORAL | 0 refills | Status: AC

## 2016-12-07 NOTE — Discharge Summary (Signed)
[employer] [facility] - [place] at [facility]   PATIENT NAME: Mike JenkinsJeremy Vincent    MR#:  [id number]  DATE OF BIRTH:  Aug 27, 1985  DATE OF ADMISSION:  12/06/2016 ADMITTING PHYSICIAN: Mike AustinPavan Pyreddy, MD  DATE OF DISCHARGE: 12/07/2016  PRIMARY CARE PHYSICIAN: Mike FlavinHoward, Kevin, MD    ADMISSION DIAGNOSIS:  Facial cellulitis [L03.211]  DISCHARGE DIAGNOSIS:  Active Problems:   Facial cellulitis   SECONDARY DIAGNOSIS:   Past Medical History:  Diagnosis Date  . Gout   . Hypertension     HOSPITAL COURSE:  31 year old male with history of essential hypertension who presented with left facial pain  1. Sepsis due to Left facial cellulitis: Patient presented with leukocytosis and tachycardia   sepsis has resolved. Facial cellulitis is improved. He will be discharged on oral Augmentin.  2. Right wrist pain: Patient was evaluated by orthopedic surgery. He has an acute wrist sprain. There is no surgical intervention required. He will continue to wear a Velcro wrist immobilizer at times for comfort. It also appears that he has a gout flare. He will continue his outpatient medications in addition to prednisone 50 mg for 4 days.  3. Essential hypertension: Continue Cozaar  4. Hypokalemia: Improved with replacement  5. History gout: Continue with plan as stated above.      DISCHARGE CONDITIONS AND DIET:   Stable for discharge on heart healthy diet  CONSULTS OBTAINED:  Treatment Team:  Mike FlakePoggi, John J, MD  DRUG ALLERGIES:   Allergies  Allergen Reactions  . Codeine     DISCHARGE MEDICATIONS:   Current Discharge Medication List    START taking these medications   Details  oxyCODONE-acetaminophen (PERCOCET/ROXICET) 5-325 MG tablet Take 1-2 tablets by mouth every 6 (six) hours as needed for moderate pain. Qty: 30 tablet, Refills: 0    predniSONE (DELTASONE) 50 MG tablet Take 1 tablet (50 mg total) by mouth daily with breakfast for 4 days. Qty: 4 tablet, Refills: 0     senna-docusate (SENOKOT-S) 8.6-50 MG tablet Take 1 tablet by mouth at bedtime as needed for mild constipation.      CONTINUE these medications which have CHANGED   Details  amoxicillin-clavulanate (AUGMENTIN) 875-125 MG tablet Take 1 tablet by mouth 2 (two) times daily for 8 days. Qty: 16 tablet, Refills: 0      CONTINUE these medications which have NOT CHANGED   Details  colchicine 0.6 MG tablet Take 1 tablet (0.6 mg total) by mouth 2 (two) times daily. Qty: 30 tablet, Refills: 1    Febuxostat 80 MG TABS Take 120 mg by mouth daily.     indomethacin (INDOCIN) 50 MG capsule Take 50 mg by mouth daily.    losartan (COZAAR) 50 MG tablet     traMADol (ULTRAM) 50 MG tablet Take 1 tablet by mouth every 6 (six) hours as needed. Refills: 0    Vitamin D, Ergocalciferol, (DRISDOL) 50000 units CAPS capsule Take 1 capsule by mouth once a week. For 12 weeks Refills: 2      STOP taking these medications     Meloxicam (MOBIC PO)           Today   CHIEF COMPLAINT:  Patient now has a gout flare of his right wrist because it was immobilized.   VITAL SIGNS:  Blood pressure (!) 147/98, pulse (!) 107, temperature 98.5 F (36.9 C), temperature source Oral, resp. rate (!) 21, height 5\' 7"  (1.702 m), weight 119.3 kg (263 lb), SpO2 99 %.   REVIEW OF SYSTEMS:  Review of Systems  Constitutional: Negative.  Negative for chills, fever and malaise/fatigue.  HENT: Negative.  Negative for ear discharge, ear pain, hearing loss, nosebleeds and sore throat.   Eyes: Negative.  Negative for blurred vision and pain.  Respiratory: Negative.  Negative for cough, hemoptysis, shortness of breath and wheezing.   Cardiovascular: Negative.  Negative for chest pain, palpitations and leg swelling.  Gastrointestinal: Negative.  Negative for abdominal pain, blood in stool, diarrhea, nausea and vomiting.  Genitourinary: Negative.  Negative for dysuria.  Musculoskeletal: Positive for joint pain (wrist pain).  Negative for back pain.  Skin:       Facial cellulitis improved  Neurological: Negative for dizziness, tremors, speech change, focal weakness, seizures and headaches.  Endo/Heme/Allergies: Negative.  Does not bruise/bleed easily.  Psychiatric/Behavioral: Negative.  Negative for depression, hallucinations and suicidal ideas.     PHYSICAL EXAMINATION:  GENERAL:  31 y.o.-year-old patient lying in the bed with no acute distress.  NECK:  Supple, no jugular venous distention. No thyroid enlargement, no tenderness.  LUNGS: Normal breath sounds bilaterally, no wheezing, rales,rhonchi  No use of accessory muscles of respiration.  CARDIOVASCULAR: S1, S2 normal. No murmurs, rubs, or gallops.  ABDOMEN: Soft, non-tender, non-distended. Bowel sounds present. No organomegaly or mass.  EXTREMITIES: No pedal edema, cyanosis, or clubbing.  PSYCHIATRIC: The patient is alert and oriented x 3.  SKIN: left facial cellulitis and edema improved Right wrist swollen tender no pain at joint line  DATA REVIEW:   CBC Recent Labs  Lab 12/06/16 0608  WBC 12.6*  HGB 14.5  HCT 42.0  PLT 275    Chemistries  Recent Labs  Lab 12/06/16 0608  NA 136  K 3.6  CL 104  CO2 22  GLUCOSE 85  BUN 9  CREATININE 0.98  CALCIUM 8.8*    Cardiac Enzymes No results for input(s): TROPONINI in the last 168 hours.  Microbiology Results  @[email or web address] @  RADIOLOGY:  Dg Wrist 2 Views Right  Result Date: 12/06/2016 CLINICAL DATA:  Right wrist pain after wrist gave out yesterday while playing with daughter. EXAM: RIGHT WRIST - 2 VIEW COMPARISON:  None. FINDINGS: There is no evidence of fracture or dislocation. There is no evidence of arthropathy or other focal bone abnormality. Mild soft tissue swelling about the wrist. IMPRESSION: Mild soft tissue swelling about the wrist. No acute osseous abnormality. Electronically Signed   By: Obie Dredge M.D.   On: 12/06/2016 14:08   Ct Maxillofacial W Contrast  Result Date:  12/06/2016 CLINICAL DATA:  Left facial swelling and pain EXAM: CT MAXILLOFACIAL WITH CONTRAST TECHNIQUE: Multidetector CT imaging of the maxillofacial structures was performed with intravenous contrast. Multiplanar CT image reconstructions were also generated. CONTRAST:  75mL ISOVUE-300 IOPAMIDOL (ISOVUE-300) INJECTION 61% COMPARISON:  None. FINDINGS: Osseous:  No facial fracture or focal osseous lesion. Orbits: The globes appear intact. Normal appearance of the intra- and extraconal fat. Symmetric extraocular muscles. Sinuses: No fluid levels or advanced mucosal thickening. Soft tissues: There is focal soft tissue swelling within the superficial soft tissues of the left nasolabial fold. No fluid collection or abscess. There is also moderate soft tissue swelling of the left infraorbital the other visible soft tissues of the face and neck are normal. Soft tissues. Limited intracranial: Normal. IMPRESSION: 1. Left facial cellulitis with inflammatory change of the skin and subcutaneous tissues of the left nasolabial fold and left infraorbital region. No abscess or drainable fluid collection. 2. No inflammatory extension into the orbit. Electronically Signed   By:  Deatra RobinsonKevin  Herman M.D.   On: 12/06/2016 00:13      Current Discharge Medication List    START taking these medications   Details  oxyCODONE-acetaminophen (PERCOCET/ROXICET) 5-325 MG tablet Take 1-2 tablets by mouth every 6 (six) hours as needed for moderate pain. Qty: 30 tablet, Refills: 0    predniSONE (DELTASONE) 50 MG tablet Take 1 tablet (50 mg total) by mouth daily with breakfast for 4 days. Qty: 4 tablet, Refills: 0    senna-docusate (SENOKOT-S) 8.6-50 MG tablet Take 1 tablet by mouth at bedtime as needed for mild constipation.      CONTINUE these medications which have CHANGED   Details  amoxicillin-clavulanate (AUGMENTIN) 875-125 MG tablet Take 1 tablet by mouth 2 (two) times daily for 8 days. Qty: 16 tablet, Refills: 0       CONTINUE these medications which have NOT CHANGED   Details  colchicine 0.6 MG tablet Take 1 tablet (0.6 mg total) by mouth 2 (two) times daily. Qty: 30 tablet, Refills: 1    Febuxostat 80 MG TABS Take 120 mg by mouth daily.     indomethacin (INDOCIN) 50 MG capsule Take 50 mg by mouth daily.    losartan (COZAAR) 50 MG tablet     traMADol (ULTRAM) 50 MG tablet Take 1 tablet by mouth every 6 (six) hours as needed. Refills: 0    Vitamin D, Ergocalciferol, (DRISDOL) 50000 units CAPS capsule Take 1 capsule by mouth once a week. For 12 weeks Refills: 2      STOP taking these medications     Meloxicam (MOBIC PO)            Management plans discussed with the patient and he is in agreement. Stable for discharge home  Patient should follow up with PCP  CODE STATUS:     Code Status Orders  (From admission, onward)        Start     Ordered   12/06/16 0324  Full code  Continuous     12/06/16 0323    Code Status History    Date Active Date Inactive Code Status Order ID Comments User Context   This patient has a current code status but no historical code status.      TOTAL TIME TAKING CARE OF THIS PATIENT: 30 minutes.    Note: This dictation was prepared with Dragon dictation along with smaller phrase technology. Any transcriptional errors that result from this process are unintentional.  Hafsah Hendler M.D on 12/07/2016 at 7:44 AM  Between 7am to 6pm - Pager - [phone] After 6pm go to [email or web address] - Social research officer, governmentpassword EPAS [facility]  [facility] [place]  Office  [phone]  CC: Primary care physician; Mike FlavinHoward, Kevin, MD

## 2016-12-07 NOTE — Progress Notes (Signed)
Discharge instructions along with home medications and follow up gone over with patient. He verbalized that he understood instructions. 3 prescriptions given to patient. IV removed. Pt being discharged home on room air, no distress noted. Otilio JeffersonMadelyn S Fenton, RN

## 2016-12-07 NOTE — Care Management Note (Signed)
Case Management Note  Patient Details  Name: Mike MuskratJeremy C Orosz MRN: [id number] Date of Birth: [birth year]  Subjective/Objective:  Admitted to [facility] with the diagnosis of left facial cellulitis. Lives with wife. Dr. Clint GuyHower is listed as primary care physician.  Takes care of all basic and instrumental activities of daily living himself, drives.  Discharge to home today per Dr. Juliene PinaMody                   Action/Plan: Received referral for Right Velco Wrist immobilizer.  Mr. [name] is wearing an immobilizer.    Expected Discharge Date:  12/07/16               Expected Discharge Plan:     In-House Referral:     Discharge planning Services     Post Acute Care Choice:    Choice offered to:     DME Arranged:    DME Agency:     HH Arranged:    HH Agency:     Status of Service:     If discussed at [employer] Meetings, dates discussed:    Additional Comments:  Gwenette GreetBrenda S Naydeen Speirs, RN MSN [facility] Care Management [phone] 12/07/2016, 8:22 AM

## 2016-12-11 LAB — CULTURE, BLOOD (ROUTINE X 2)
Culture: NO GROWTH
Culture: NO GROWTH
SPECIAL REQUESTS: ADEQUATE
Special Requests: ADEQUATE

## 2017-04-26 DIAGNOSIS — S29012A Strain of muscle and tendon of back wall of thorax, initial encounter: Secondary | ICD-10-CM | POA: Diagnosis not present

## 2017-04-26 DIAGNOSIS — Z79899 Other long term (current) drug therapy: Secondary | ICD-10-CM | POA: Diagnosis not present

## 2017-04-26 DIAGNOSIS — E559 Vitamin D deficiency, unspecified: Secondary | ICD-10-CM | POA: Diagnosis not present

## 2017-04-26 DIAGNOSIS — M542 Cervicalgia: Secondary | ICD-10-CM | POA: Diagnosis not present

## 2017-04-26 DIAGNOSIS — I10 Essential (primary) hypertension: Secondary | ICD-10-CM | POA: Diagnosis not present

## 2017-04-26 DIAGNOSIS — M1A09X1 Idiopathic chronic gout, multiple sites, with tophus (tophi): Secondary | ICD-10-CM | POA: Diagnosis not present

## 2017-04-26 DIAGNOSIS — Z5181 Encounter for therapeutic drug level monitoring: Secondary | ICD-10-CM | POA: Diagnosis not present

## 2017-04-26 DIAGNOSIS — X58XXXA Exposure to other specified factors, initial encounter: Secondary | ICD-10-CM | POA: Diagnosis not present

## 2017-06-03 DIAGNOSIS — Z6841 Body Mass Index (BMI) 40.0 and over, adult: Secondary | ICD-10-CM | POA: Diagnosis not present

## 2017-06-03 DIAGNOSIS — M1A00X1 Idiopathic chronic gout, unspecified site, with tophus (tophi): Secondary | ICD-10-CM | POA: Diagnosis not present

## 2017-06-03 DIAGNOSIS — I10 Essential (primary) hypertension: Secondary | ICD-10-CM | POA: Diagnosis not present

## 2017-11-03 DIAGNOSIS — Z23 Encounter for immunization: Secondary | ICD-10-CM | POA: Diagnosis not present

## 2018-02-18 DIAGNOSIS — M25562 Pain in left knee: Secondary | ICD-10-CM | POA: Diagnosis not present

## 2018-02-28 DIAGNOSIS — Z5181 Encounter for therapeutic drug level monitoring: Secondary | ICD-10-CM | POA: Diagnosis not present

## 2018-02-28 DIAGNOSIS — Z6841 Body Mass Index (BMI) 40.0 and over, adult: Secondary | ICD-10-CM | POA: Diagnosis not present

## 2018-02-28 DIAGNOSIS — M1A09X1 Idiopathic chronic gout, multiple sites, with tophus (tophi): Secondary | ICD-10-CM | POA: Diagnosis not present

## 2018-02-28 DIAGNOSIS — Z79899 Other long term (current) drug therapy: Secondary | ICD-10-CM | POA: Diagnosis not present

## 2018-04-08 IMAGING — CT CT MAXILLOFACIAL W/ CM
3 series · 16 of 47 positions shown, 19 images · IV contrast (iopamidol)
Comparison: None.

CLINICAL DATA: Left facial swelling and pain

EXAM:
CT MAXILLOFACIAL WITH CONTRAST
TECHNIQUE: Multidetector CT imaging of the maxillofacial structures was
performed with intravenous contrast. Multiplanar CT image
reconstructions were also generated.
CONTRAST:  75mL 24I0M6-Y99 IOPAMIDOL (24I0M6-Y99) INJECTION 61%

[Series 2: max soft · axial · 0.39mm/px · z∈[+118,+280]mm · 10 of 95 slices shown, 13 images]
[im 7/95  brain]
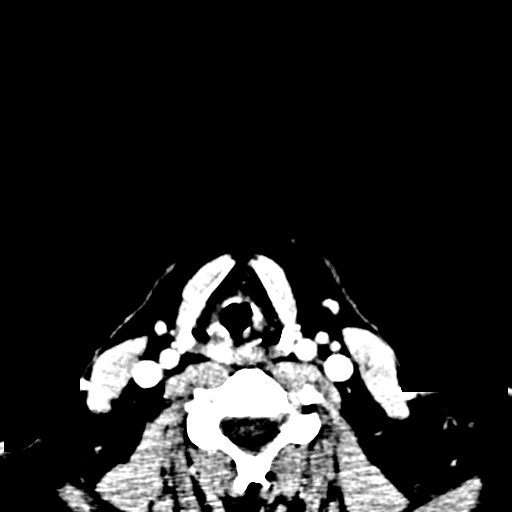
[im 7/95  bone]
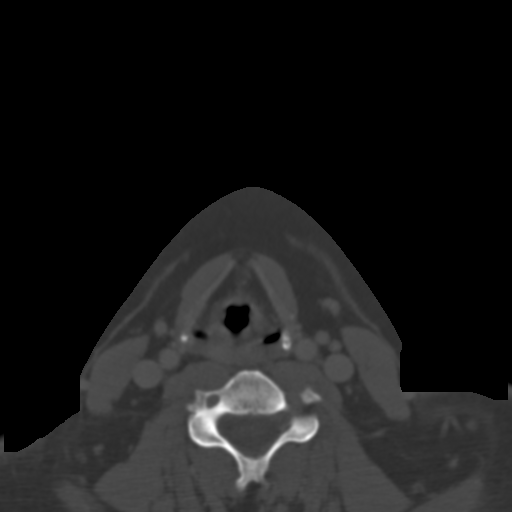
[im 17/95  bone]
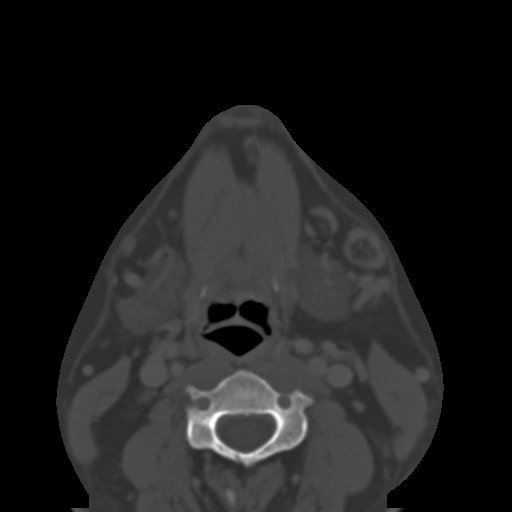
[im 26/95  bone]
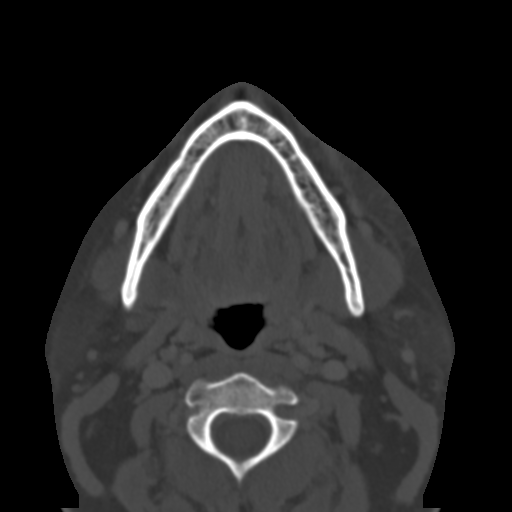
[im 33/95  bone]
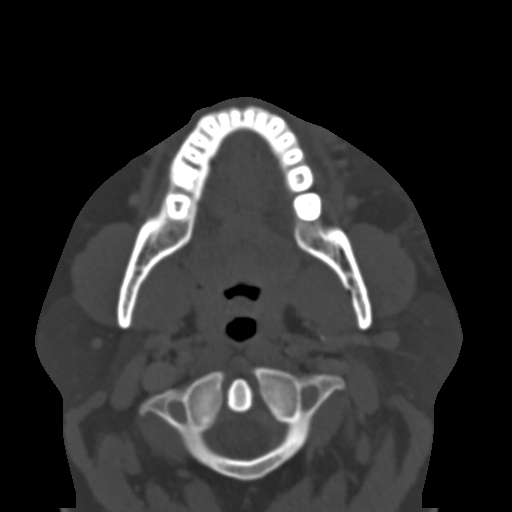
[im 43/95  brain]
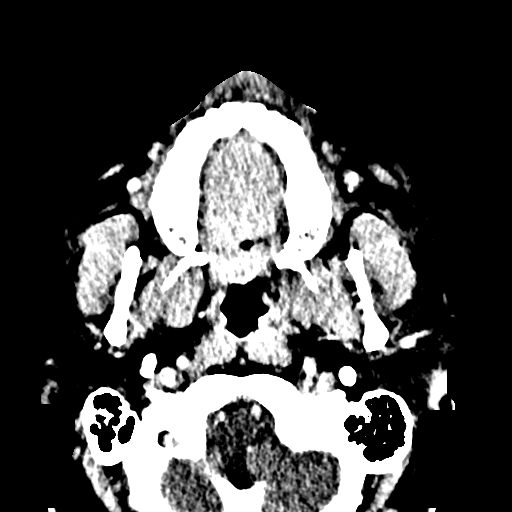
[im 43/95  bone]
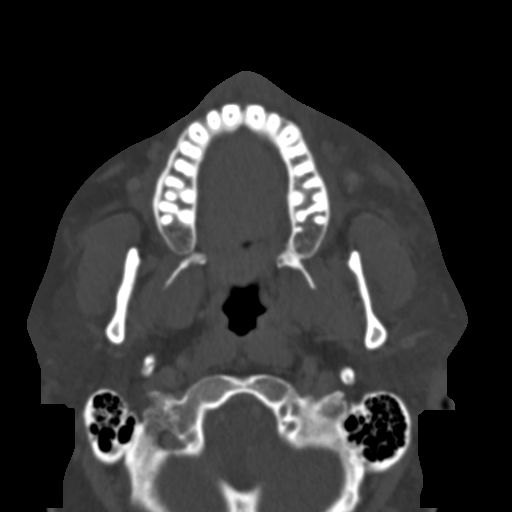
[im 52/95  bone]
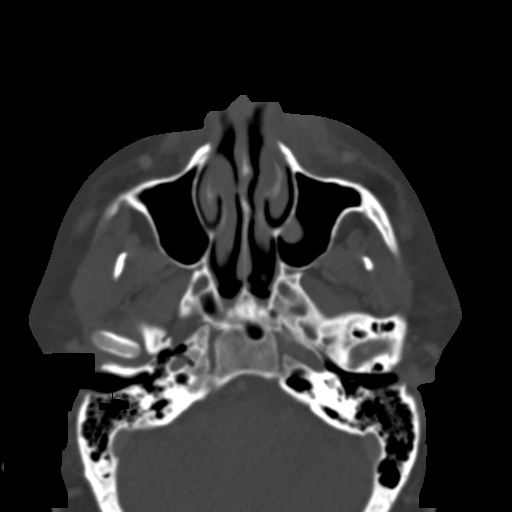
[im 62/95  bone]
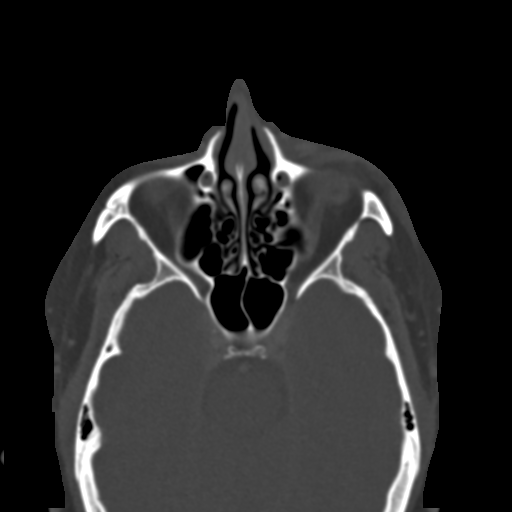
[im 72/95  bone]
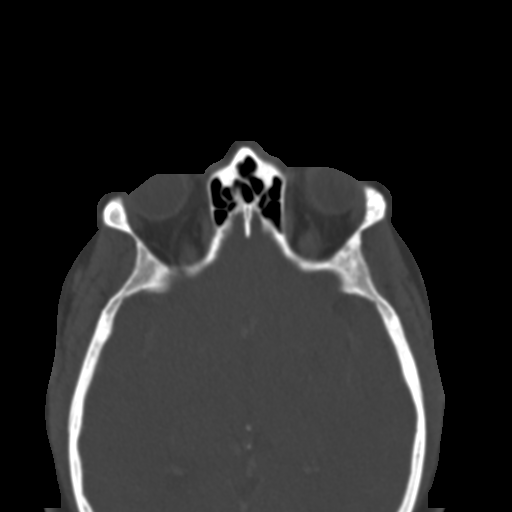
[im 78/95  brain]
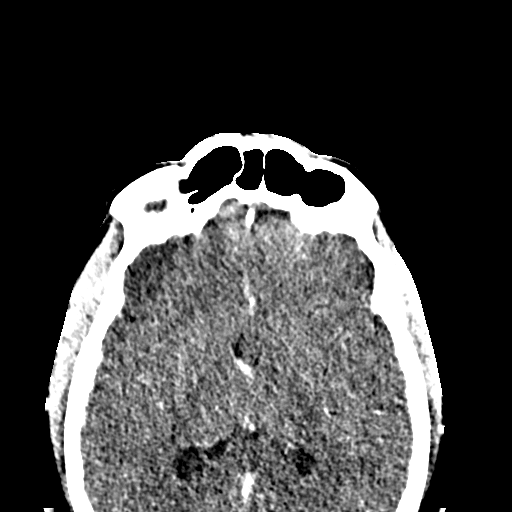
[im 78/95  bone]
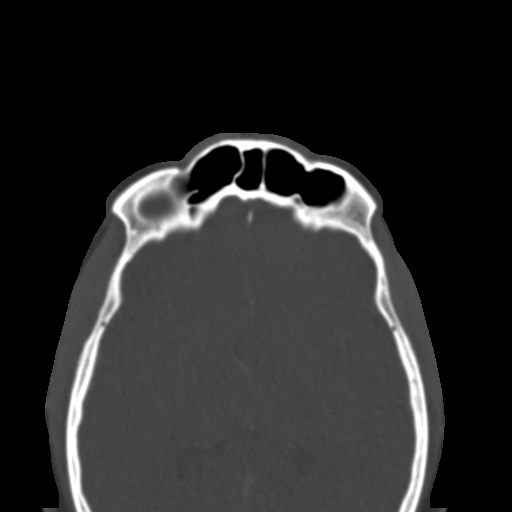
[im 88/95  bone]
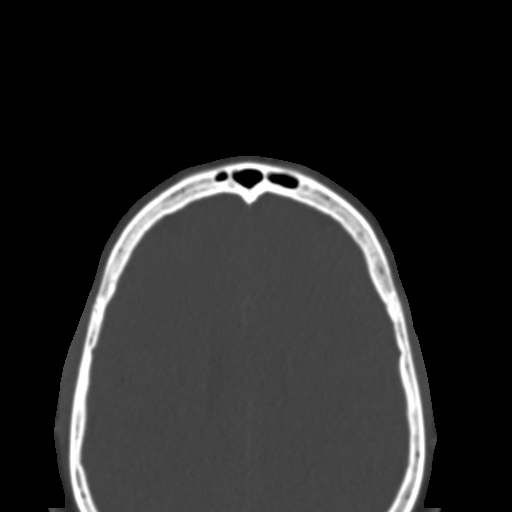

[Series 4: coronal soft · coronal · 0.38mm/px · 3 of 91 slices shown]
[im 31/91  bone]
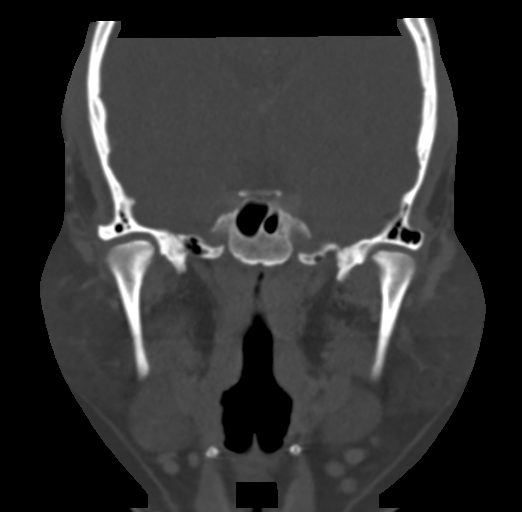
[im 41/91  bone]
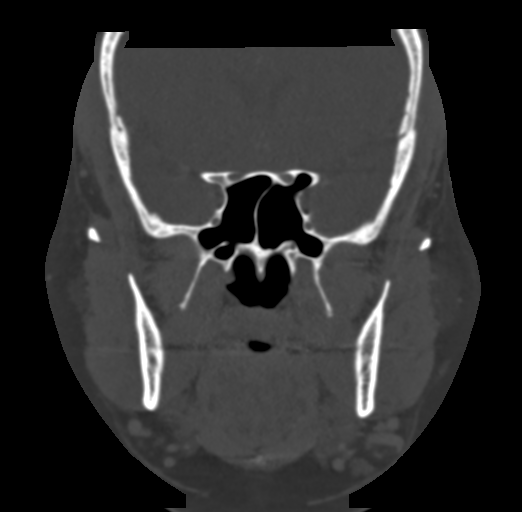
[im 51/91  bone]
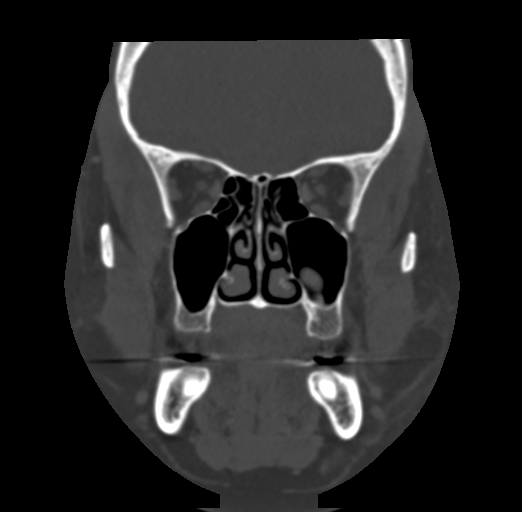

[Series 5: sagittal soft · sagittal · 0.35mm/px · 3 of 101 slices shown]
[im 34/101  bone]
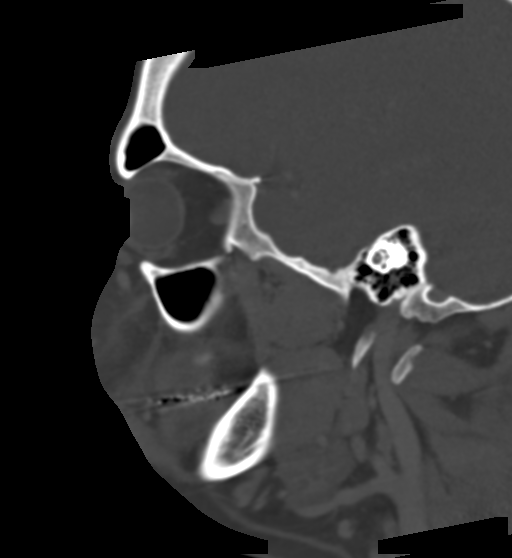
[im 51/101  bone]
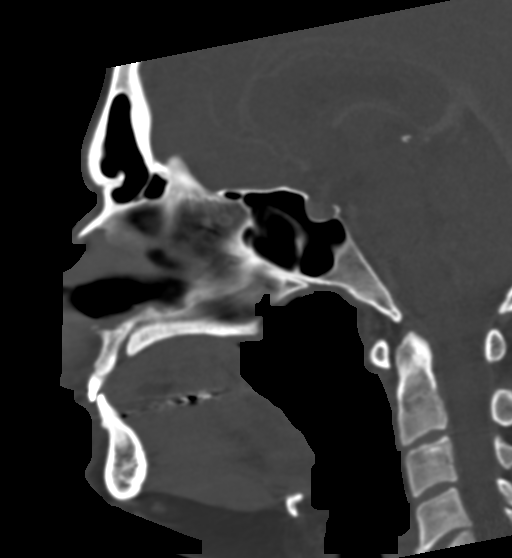
[im 67/101  bone]
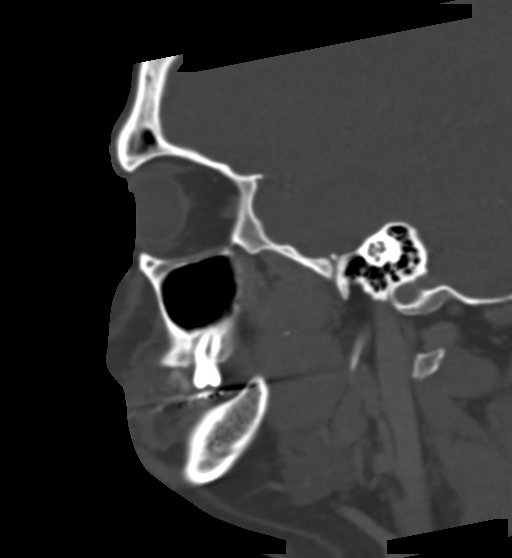

[16 of 47 positions shown; findings below may reference images not displayed]

FINDINGS: Osseous:  No facial fracture or focal osseous lesion.

Orbits: The globes appear intact. Normal appearance of the intra-
and extraconal fat. Symmetric extraocular muscles.

Sinuses: No fluid levels or advanced mucosal thickening.

Soft tissues: There is focal soft tissue swelling within the
superficial soft tissues of the left nasolabial fold. No fluid
collection or abscess. There is also moderate soft tissue swelling
of the left infraorbital the other visible soft tissues of the face
and neck are normal. Soft tissues.

Limited intracranial: Normal.
IMPRESSION: 1. Left facial cellulitis with inflammatory change of the skin and
subcutaneous tissues of the left nasolabial fold and left
infraorbital region. No abscess or drainable fluid collection.
2. No inflammatory extension into the orbit.

## 2018-04-09 IMAGING — DX DG WRIST 2V*R*
2 series · 2 of 2 positions shown · non-contrast
Comparison: None.

CLINICAL DATA: Right wrist pain after wrist gave out yesterday
while playing with daughter.

EXAM:
RIGHT WRIST - 2 VIEW

[wrist ap]
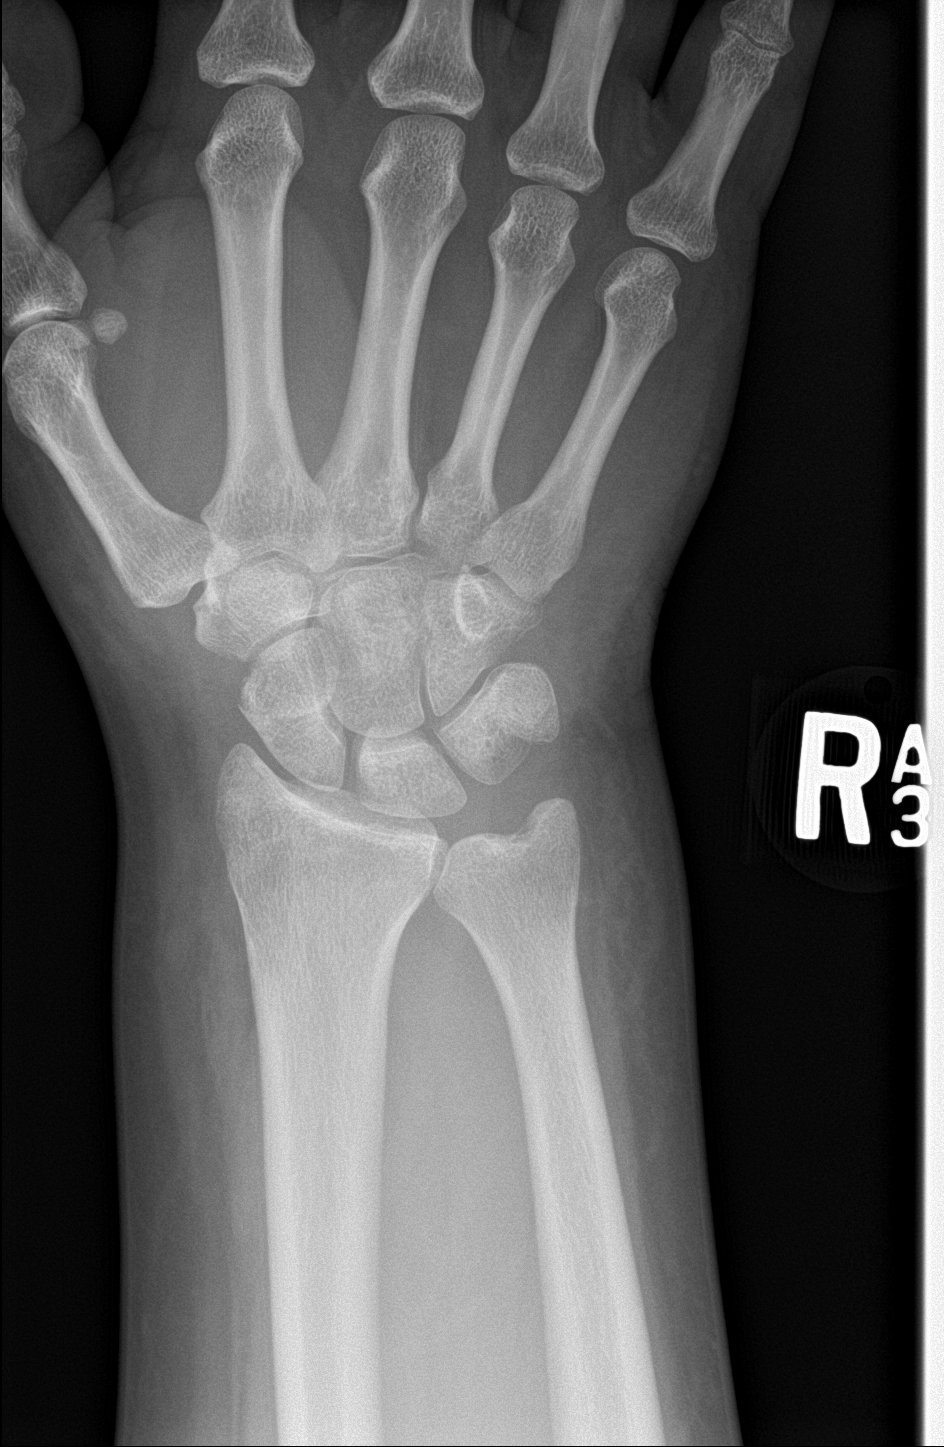

[wrist lat]
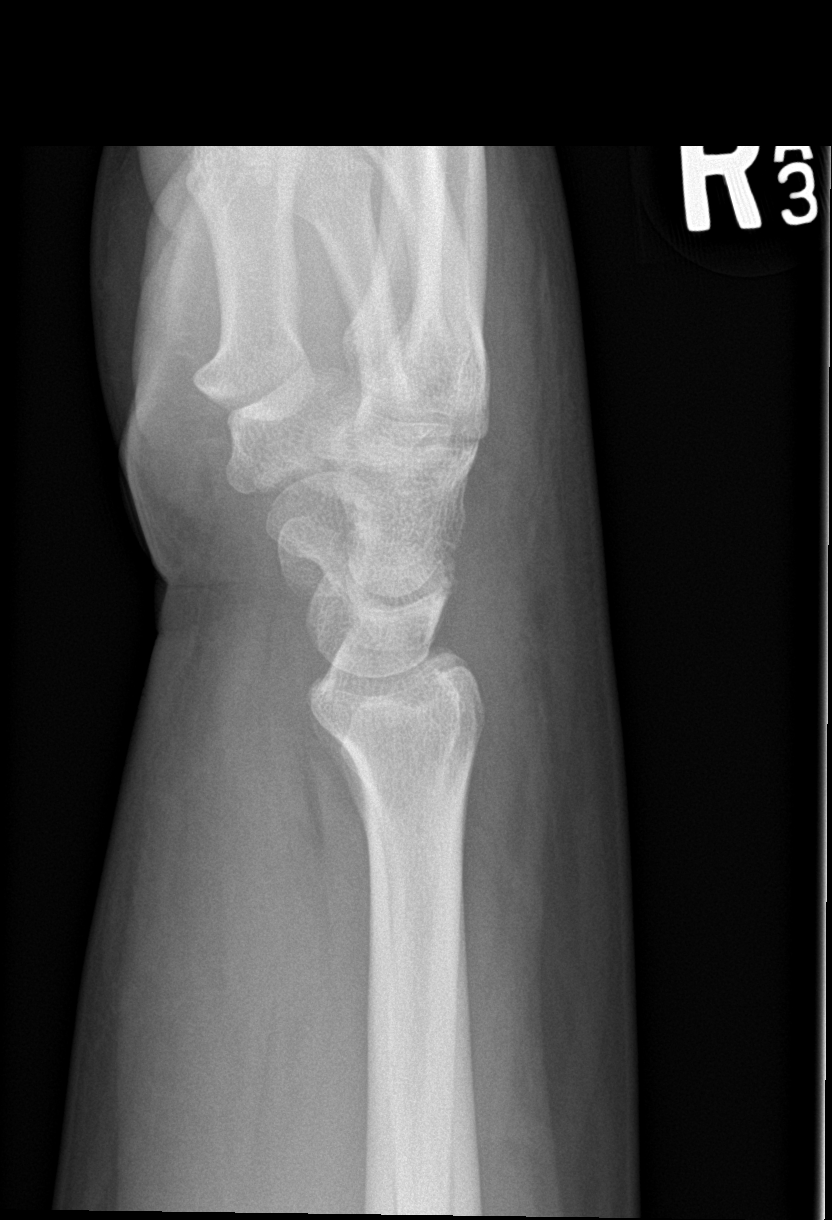

[2 of 2 positions shown; findings below may reference images not displayed]

FINDINGS: There is no evidence of fracture or dislocation. There is no
evidence of arthropathy or other focal bone abnormality. Mild soft
tissue swelling about the wrist.
IMPRESSION: Mild soft tissue swelling about the wrist. No acute osseous
abnormality.

## 2018-07-25 DIAGNOSIS — M1A09X1 Idiopathic chronic gout, multiple sites, with tophus (tophi): Secondary | ICD-10-CM | POA: Diagnosis not present

## 2018-07-25 DIAGNOSIS — Z6841 Body Mass Index (BMI) 40.0 and over, adult: Secondary | ICD-10-CM | POA: Diagnosis not present

## 2018-07-25 DIAGNOSIS — K625 Hemorrhage of anus and rectum: Secondary | ICD-10-CM | POA: Diagnosis not present

## 2018-10-03 DIAGNOSIS — Z23 Encounter for immunization: Secondary | ICD-10-CM | POA: Diagnosis not present

## 2018-12-15 DIAGNOSIS — M1A00X1 Idiopathic chronic gout, unspecified site, with tophus (tophi): Secondary | ICD-10-CM | POA: Diagnosis not present

## 2018-12-15 DIAGNOSIS — L0390 Cellulitis, unspecified: Secondary | ICD-10-CM | POA: Diagnosis not present

## 2018-12-15 DIAGNOSIS — I10 Essential (primary) hypertension: Secondary | ICD-10-CM | POA: Diagnosis not present

## 2019-09-23 DIAGNOSIS — Z6841 Body Mass Index (BMI) 40.0 and over, adult: Secondary | ICD-10-CM | POA: Diagnosis not present

## 2019-09-26 DIAGNOSIS — R29818 Other symptoms and signs involving the nervous system: Secondary | ICD-10-CM | POA: Diagnosis not present

## 2019-09-26 DIAGNOSIS — Z6841 Body Mass Index (BMI) 40.0 and over, adult: Secondary | ICD-10-CM | POA: Diagnosis not present

## 2019-09-26 DIAGNOSIS — R519 Headache, unspecified: Secondary | ICD-10-CM | POA: Diagnosis not present

## 2019-09-26 DIAGNOSIS — R0683 Snoring: Secondary | ICD-10-CM | POA: Diagnosis not present

## 2019-09-26 DIAGNOSIS — G478 Other sleep disorders: Secondary | ICD-10-CM | POA: Diagnosis not present

## 2019-09-26 DIAGNOSIS — R351 Nocturia: Secondary | ICD-10-CM | POA: Diagnosis not present

## 2019-09-26 DIAGNOSIS — G4733 Obstructive sleep apnea (adult) (pediatric): Secondary | ICD-10-CM | POA: Diagnosis not present

## 2019-10-02 DIAGNOSIS — M8949 Other hypertrophic osteoarthropathy, multiple sites: Secondary | ICD-10-CM | POA: Diagnosis not present

## 2019-10-02 DIAGNOSIS — Z5181 Encounter for therapeutic drug level monitoring: Secondary | ICD-10-CM | POA: Diagnosis not present

## 2019-10-02 DIAGNOSIS — R531 Weakness: Secondary | ICD-10-CM | POA: Diagnosis not present

## 2019-10-02 DIAGNOSIS — M1A09X1 Idiopathic chronic gout, multiple sites, with tophus (tophi): Secondary | ICD-10-CM | POA: Diagnosis not present

## 2019-10-02 DIAGNOSIS — Z79899 Other long term (current) drug therapy: Secondary | ICD-10-CM | POA: Diagnosis not present

## 2019-10-02 DIAGNOSIS — Z23 Encounter for immunization: Secondary | ICD-10-CM | POA: Diagnosis not present

## 2019-10-10 DIAGNOSIS — M1A00X1 Idiopathic chronic gout, unspecified site, with tophus (tophi): Secondary | ICD-10-CM | POA: Diagnosis not present

## 2019-10-10 DIAGNOSIS — R000 Tachycardia, unspecified: Secondary | ICD-10-CM | POA: Diagnosis not present

## 2019-10-10 DIAGNOSIS — I10 Essential (primary) hypertension: Secondary | ICD-10-CM | POA: Diagnosis not present

## 2019-10-30 DIAGNOSIS — R531 Weakness: Secondary | ICD-10-CM | POA: Diagnosis not present

## 2019-11-06 DIAGNOSIS — I10 Essential (primary) hypertension: Secondary | ICD-10-CM | POA: Diagnosis not present

## 2019-11-06 DIAGNOSIS — Z6841 Body Mass Index (BMI) 40.0 and over, adult: Secondary | ICD-10-CM | POA: Diagnosis not present

## 2019-11-06 DIAGNOSIS — I361 Nonrheumatic tricuspid (valve) insufficiency: Secondary | ICD-10-CM | POA: Diagnosis not present

## 2019-11-06 DIAGNOSIS — E669 Obesity, unspecified: Secondary | ICD-10-CM | POA: Diagnosis not present

## 2019-11-06 DIAGNOSIS — I159 Secondary hypertension, unspecified: Secondary | ICD-10-CM | POA: Diagnosis not present

## 2019-11-06 DIAGNOSIS — R531 Weakness: Secondary | ICD-10-CM | POA: Diagnosis not present

## 2019-11-12 DIAGNOSIS — H43823 Vitreomacular adhesion, bilateral: Secondary | ICD-10-CM | POA: Diagnosis not present

## 2019-11-12 DIAGNOSIS — H3540 Unspecified peripheral retinal degeneration: Secondary | ICD-10-CM | POA: Diagnosis not present

## 2019-11-12 DIAGNOSIS — D3131 Benign neoplasm of right choroid: Secondary | ICD-10-CM | POA: Diagnosis not present

## 2019-11-20 DIAGNOSIS — I10 Essential (primary) hypertension: Secondary | ICD-10-CM | POA: Diagnosis not present

## 2019-11-20 DIAGNOSIS — Z885 Allergy status to narcotic agent status: Secondary | ICD-10-CM | POA: Diagnosis not present

## 2019-11-20 DIAGNOSIS — E538 Deficiency of other specified B group vitamins: Secondary | ICD-10-CM | POA: Diagnosis not present

## 2019-11-20 DIAGNOSIS — M1990 Unspecified osteoarthritis, unspecified site: Secondary | ICD-10-CM | POA: Diagnosis not present

## 2019-11-20 DIAGNOSIS — M109 Gout, unspecified: Secondary | ICD-10-CM | POA: Diagnosis not present

## 2019-11-20 DIAGNOSIS — Z713 Dietary counseling and surveillance: Secondary | ICD-10-CM | POA: Diagnosis not present

## 2019-11-20 DIAGNOSIS — G4733 Obstructive sleep apnea (adult) (pediatric): Secondary | ICD-10-CM | POA: Diagnosis not present

## 2019-11-20 DIAGNOSIS — Z791 Long term (current) use of non-steroidal anti-inflammatories (NSAID): Secondary | ICD-10-CM | POA: Diagnosis not present

## 2019-11-20 DIAGNOSIS — Z6841 Body Mass Index (BMI) 40.0 and over, adult: Secondary | ICD-10-CM | POA: Diagnosis not present

## 2019-11-21 DIAGNOSIS — G4733 Obstructive sleep apnea (adult) (pediatric): Secondary | ICD-10-CM | POA: Diagnosis not present

## 2019-11-21 DIAGNOSIS — Z7984 Long term (current) use of oral hypoglycemic drugs: Secondary | ICD-10-CM | POA: Diagnosis not present

## 2019-11-21 DIAGNOSIS — M1990 Unspecified osteoarthritis, unspecified site: Secondary | ICD-10-CM | POA: Diagnosis not present

## 2019-11-21 DIAGNOSIS — I10 Essential (primary) hypertension: Secondary | ICD-10-CM | POA: Diagnosis not present

## 2019-11-21 DIAGNOSIS — K2950 Unspecified chronic gastritis without bleeding: Secondary | ICD-10-CM | POA: Diagnosis not present

## 2019-11-21 DIAGNOSIS — Z79899 Other long term (current) drug therapy: Secondary | ICD-10-CM | POA: Diagnosis not present

## 2019-11-21 DIAGNOSIS — Z01818 Encounter for other preprocedural examination: Secondary | ICD-10-CM | POA: Diagnosis not present

## 2019-11-21 DIAGNOSIS — Z6841 Body Mass Index (BMI) 40.0 and over, adult: Secondary | ICD-10-CM | POA: Diagnosis not present

## 2019-12-18 DIAGNOSIS — Z7984 Long term (current) use of oral hypoglycemic drugs: Secondary | ICD-10-CM | POA: Diagnosis not present

## 2019-12-18 DIAGNOSIS — I10 Essential (primary) hypertension: Secondary | ICD-10-CM | POA: Diagnosis not present

## 2019-12-18 DIAGNOSIS — G4733 Obstructive sleep apnea (adult) (pediatric): Secondary | ICD-10-CM | POA: Diagnosis not present

## 2019-12-18 DIAGNOSIS — Z6841 Body Mass Index (BMI) 40.0 and over, adult: Secondary | ICD-10-CM | POA: Diagnosis not present

## 2019-12-18 DIAGNOSIS — Z79899 Other long term (current) drug therapy: Secondary | ICD-10-CM | POA: Diagnosis not present

## 2019-12-18 DIAGNOSIS — M109 Gout, unspecified: Secondary | ICD-10-CM | POA: Diagnosis not present

## 2019-12-18 DIAGNOSIS — M1990 Unspecified osteoarthritis, unspecified site: Secondary | ICD-10-CM | POA: Diagnosis not present

## 2019-12-20 DIAGNOSIS — Z6841 Body Mass Index (BMI) 40.0 and over, adult: Secondary | ICD-10-CM | POA: Diagnosis not present

## 2019-12-20 DIAGNOSIS — E669 Obesity, unspecified: Secondary | ICD-10-CM | POA: Diagnosis not present

## 2019-12-20 DIAGNOSIS — I10 Essential (primary) hypertension: Secondary | ICD-10-CM | POA: Diagnosis not present

## 2019-12-20 DIAGNOSIS — Z713 Dietary counseling and surveillance: Secondary | ICD-10-CM | POA: Diagnosis not present

## 2019-12-23 DIAGNOSIS — E538 Deficiency of other specified B group vitamins: Secondary | ICD-10-CM | POA: Diagnosis not present

## 2019-12-23 DIAGNOSIS — Z6841 Body Mass Index (BMI) 40.0 and over, adult: Secondary | ICD-10-CM | POA: Diagnosis not present

## 2019-12-23 DIAGNOSIS — M109 Gout, unspecified: Secondary | ICD-10-CM | POA: Diagnosis not present

## 2019-12-23 DIAGNOSIS — I10 Essential (primary) hypertension: Secondary | ICD-10-CM | POA: Diagnosis not present

## 2020-01-07 DIAGNOSIS — E538 Deficiency of other specified B group vitamins: Secondary | ICD-10-CM | POA: Diagnosis not present

## 2020-01-07 DIAGNOSIS — M5416 Radiculopathy, lumbar region: Secondary | ICD-10-CM | POA: Diagnosis not present

## 2020-01-07 DIAGNOSIS — I10 Essential (primary) hypertension: Secondary | ICD-10-CM | POA: Diagnosis not present

## 2020-01-07 DIAGNOSIS — Z6841 Body Mass Index (BMI) 40.0 and over, adult: Secondary | ICD-10-CM | POA: Diagnosis not present

## 2020-01-07 DIAGNOSIS — R531 Weakness: Secondary | ICD-10-CM | POA: Diagnosis not present

## 2020-01-07 DIAGNOSIS — M5417 Radiculopathy, lumbosacral region: Secondary | ICD-10-CM | POA: Diagnosis not present

## 2020-01-07 DIAGNOSIS — M6281 Muscle weakness (generalized): Secondary | ICD-10-CM | POA: Diagnosis not present

## 2020-01-09 DIAGNOSIS — F54 Psychological and behavioral factors associated with disorders or diseases classified elsewhere: Secondary | ICD-10-CM | POA: Diagnosis not present

## 2020-01-09 DIAGNOSIS — Z6841 Body Mass Index (BMI) 40.0 and over, adult: Secondary | ICD-10-CM | POA: Diagnosis not present

## 2020-02-13 DIAGNOSIS — M4727 Other spondylosis with radiculopathy, lumbosacral region: Secondary | ICD-10-CM | POA: Diagnosis not present

## 2020-02-13 DIAGNOSIS — M7138 Other bursal cyst, other site: Secondary | ICD-10-CM | POA: Diagnosis not present

## 2020-02-13 DIAGNOSIS — M47816 Spondylosis without myelopathy or radiculopathy, lumbar region: Secondary | ICD-10-CM | POA: Diagnosis not present

## 2020-02-13 DIAGNOSIS — M48061 Spinal stenosis, lumbar region without neurogenic claudication: Secondary | ICD-10-CM | POA: Diagnosis not present

## 2020-02-13 DIAGNOSIS — M4726 Other spondylosis with radiculopathy, lumbar region: Secondary | ICD-10-CM | POA: Diagnosis not present

## 2020-02-13 DIAGNOSIS — M5416 Radiculopathy, lumbar region: Secondary | ICD-10-CM | POA: Diagnosis not present

## 2020-02-27 DIAGNOSIS — Z6841 Body Mass Index (BMI) 40.0 and over, adult: Secondary | ICD-10-CM | POA: Diagnosis not present

## 2020-02-27 DIAGNOSIS — M7138 Other bursal cyst, other site: Secondary | ICD-10-CM | POA: Diagnosis not present

## 2020-02-27 DIAGNOSIS — M5136 Other intervertebral disc degeneration, lumbar region: Secondary | ICD-10-CM | POA: Diagnosis not present

## 2020-02-27 DIAGNOSIS — M5416 Radiculopathy, lumbar region: Secondary | ICD-10-CM | POA: Diagnosis not present

## 2020-03-05 DIAGNOSIS — Z7984 Long term (current) use of oral hypoglycemic drugs: Secondary | ICD-10-CM | POA: Diagnosis not present

## 2020-03-05 DIAGNOSIS — Z6841 Body Mass Index (BMI) 40.0 and over, adult: Secondary | ICD-10-CM | POA: Diagnosis not present

## 2020-03-05 DIAGNOSIS — M1711 Unilateral primary osteoarthritis, right knee: Secondary | ICD-10-CM | POA: Diagnosis not present

## 2020-03-05 DIAGNOSIS — G4733 Obstructive sleep apnea (adult) (pediatric): Secondary | ICD-10-CM | POA: Diagnosis not present

## 2020-03-05 DIAGNOSIS — I10 Essential (primary) hypertension: Secondary | ICD-10-CM | POA: Diagnosis not present

## 2020-03-05 DIAGNOSIS — Z8249 Family history of ischemic heart disease and other diseases of the circulatory system: Secondary | ICD-10-CM | POA: Diagnosis not present

## 2020-03-05 DIAGNOSIS — Z5181 Encounter for therapeutic drug level monitoring: Secondary | ICD-10-CM | POA: Diagnosis not present

## 2020-03-05 DIAGNOSIS — Z885 Allergy status to narcotic agent status: Secondary | ICD-10-CM | POA: Diagnosis not present

## 2020-03-05 DIAGNOSIS — Z23 Encounter for immunization: Secondary | ICD-10-CM | POA: Diagnosis not present

## 2020-03-05 DIAGNOSIS — Z79899 Other long term (current) drug therapy: Secondary | ICD-10-CM | POA: Diagnosis not present

## 2020-03-05 DIAGNOSIS — M109 Gout, unspecified: Secondary | ICD-10-CM | POA: Diagnosis not present

## 2020-03-05 DIAGNOSIS — M1A09X1 Idiopathic chronic gout, multiple sites, with tophus (tophi): Secondary | ICD-10-CM | POA: Diagnosis not present

## 2020-03-09 DIAGNOSIS — Z6841 Body Mass Index (BMI) 40.0 and over, adult: Secondary | ICD-10-CM | POA: Diagnosis not present

## 2020-03-09 DIAGNOSIS — Z20822 Contact with and (suspected) exposure to covid-19: Secondary | ICD-10-CM | POA: Diagnosis not present

## 2020-03-09 DIAGNOSIS — Z01812 Encounter for preprocedural laboratory examination: Secondary | ICD-10-CM | POA: Diagnosis not present

## 2020-04-02 DIAGNOSIS — M170 Bilateral primary osteoarthritis of knee: Secondary | ICD-10-CM | POA: Diagnosis not present

## 2020-04-07 DIAGNOSIS — Z20822 Contact with and (suspected) exposure to covid-19: Secondary | ICD-10-CM | POA: Diagnosis not present

## 2020-04-07 DIAGNOSIS — Z01812 Encounter for preprocedural laboratory examination: Secondary | ICD-10-CM | POA: Diagnosis not present

## 2020-04-07 DIAGNOSIS — Z6841 Body Mass Index (BMI) 40.0 and over, adult: Secondary | ICD-10-CM | POA: Diagnosis not present

## 2020-04-09 DIAGNOSIS — I10 Essential (primary) hypertension: Secondary | ICD-10-CM | POA: Diagnosis not present

## 2020-04-09 DIAGNOSIS — G4733 Obstructive sleep apnea (adult) (pediatric): Secondary | ICD-10-CM | POA: Diagnosis not present

## 2020-04-09 DIAGNOSIS — Z6841 Body Mass Index (BMI) 40.0 and over, adult: Secondary | ICD-10-CM | POA: Diagnosis not present

## 2020-04-09 DIAGNOSIS — M1A09X0 Idiopathic chronic gout, multiple sites, without tophus (tophi): Secondary | ICD-10-CM | POA: Diagnosis not present

## 2020-04-09 DIAGNOSIS — M1990 Unspecified osteoarthritis, unspecified site: Secondary | ICD-10-CM | POA: Diagnosis not present

## 2020-04-20 DIAGNOSIS — Z09 Encounter for follow-up examination after completed treatment for conditions other than malignant neoplasm: Secondary | ICD-10-CM | POA: Diagnosis not present

## 2020-04-20 DIAGNOSIS — Z9884 Bariatric surgery status: Secondary | ICD-10-CM | POA: Diagnosis not present

## 2020-05-12 DIAGNOSIS — H43823 Vitreomacular adhesion, bilateral: Secondary | ICD-10-CM | POA: Diagnosis not present

## 2020-05-12 DIAGNOSIS — D3131 Benign neoplasm of right choroid: Secondary | ICD-10-CM | POA: Diagnosis not present

## 2020-07-28 DIAGNOSIS — Z9884 Bariatric surgery status: Secondary | ICD-10-CM | POA: Diagnosis not present

## 2020-07-28 DIAGNOSIS — Z1321 Encounter for screening for nutritional disorder: Secondary | ICD-10-CM | POA: Diagnosis not present

## 2020-07-28 DIAGNOSIS — R1013 Epigastric pain: Secondary | ICD-10-CM | POA: Diagnosis not present

## 2020-07-28 DIAGNOSIS — R634 Abnormal weight loss: Secondary | ICD-10-CM | POA: Diagnosis not present

## 2020-07-28 DIAGNOSIS — Z6834 Body mass index (BMI) 34.0-34.9, adult: Secondary | ICD-10-CM | POA: Diagnosis not present

## 2020-07-28 DIAGNOSIS — Z7189 Other specified counseling: Secondary | ICD-10-CM | POA: Diagnosis not present

## 2020-08-06 DIAGNOSIS — Z1321 Encounter for screening for nutritional disorder: Secondary | ICD-10-CM | POA: Diagnosis not present

## 2020-08-06 DIAGNOSIS — Z9884 Bariatric surgery status: Secondary | ICD-10-CM | POA: Diagnosis not present

## 2020-08-06 DIAGNOSIS — R634 Abnormal weight loss: Secondary | ICD-10-CM | POA: Diagnosis not present

## 2020-08-06 DIAGNOSIS — Z7189 Other specified counseling: Secondary | ICD-10-CM | POA: Diagnosis not present

## 2020-08-06 DIAGNOSIS — Z6833 Body mass index (BMI) 33.0-33.9, adult: Secondary | ICD-10-CM | POA: Diagnosis not present

## 2020-08-06 DIAGNOSIS — M1A09X1 Idiopathic chronic gout, multiple sites, with tophus (tophi): Secondary | ICD-10-CM | POA: Diagnosis not present

## 2020-08-06 DIAGNOSIS — Z6834 Body mass index (BMI) 34.0-34.9, adult: Secondary | ICD-10-CM | POA: Diagnosis not present

## 2020-11-12 DIAGNOSIS — M1A09X1 Idiopathic chronic gout, multiple sites, with tophus (tophi): Secondary | ICD-10-CM | POA: Diagnosis not present

## 2020-11-12 DIAGNOSIS — Z79899 Other long term (current) drug therapy: Secondary | ICD-10-CM | POA: Diagnosis not present

## 2020-11-12 DIAGNOSIS — Z6831 Body mass index (BMI) 31.0-31.9, adult: Secondary | ICD-10-CM | POA: Diagnosis not present

## 2020-11-12 DIAGNOSIS — M7551 Bursitis of right shoulder: Secondary | ICD-10-CM | POA: Diagnosis not present

## 2020-11-12 DIAGNOSIS — M7552 Bursitis of left shoulder: Secondary | ICD-10-CM | POA: Diagnosis not present

## 2020-11-20 DIAGNOSIS — D3131 Benign neoplasm of right choroid: Secondary | ICD-10-CM | POA: Diagnosis not present

## 2020-11-20 DIAGNOSIS — H43823 Vitreomacular adhesion, bilateral: Secondary | ICD-10-CM | POA: Diagnosis not present

## 2021-03-24 DIAGNOSIS — M1A09X1 Idiopathic chronic gout, multiple sites, with tophus (tophi): Secondary | ICD-10-CM | POA: Diagnosis not present

## 2021-03-24 DIAGNOSIS — M25531 Pain in right wrist: Secondary | ICD-10-CM | POA: Diagnosis not present

## 2021-03-24 DIAGNOSIS — M7989 Other specified soft tissue disorders: Secondary | ICD-10-CM | POA: Diagnosis not present

## 2021-03-24 DIAGNOSIS — Z791 Long term (current) use of non-steroidal anti-inflammatories (NSAID): Secondary | ICD-10-CM | POA: Diagnosis not present

## 2021-03-24 DIAGNOSIS — Z5181 Encounter for therapeutic drug level monitoring: Secondary | ICD-10-CM | POA: Diagnosis not present

## 2021-03-24 DIAGNOSIS — I10 Essential (primary) hypertension: Secondary | ICD-10-CM | POA: Diagnosis not present

## 2021-03-24 DIAGNOSIS — Z79899 Other long term (current) drug therapy: Secondary | ICD-10-CM | POA: Diagnosis not present

## 2021-03-24 DIAGNOSIS — M25731 Osteophyte, right wrist: Secondary | ICD-10-CM | POA: Diagnosis not present

## 2021-07-21 DIAGNOSIS — Z79899 Other long term (current) drug therapy: Secondary | ICD-10-CM | POA: Diagnosis not present

## 2021-07-21 DIAGNOSIS — Z6832 Body mass index (BMI) 32.0-32.9, adult: Secondary | ICD-10-CM | POA: Diagnosis not present

## 2021-07-21 DIAGNOSIS — Z5181 Encounter for therapeutic drug level monitoring: Secondary | ICD-10-CM | POA: Diagnosis not present

## 2021-07-21 DIAGNOSIS — M1A09X1 Idiopathic chronic gout, multiple sites, with tophus (tophi): Secondary | ICD-10-CM | POA: Diagnosis not present

## 2021-07-21 DIAGNOSIS — I10 Essential (primary) hypertension: Secondary | ICD-10-CM | POA: Diagnosis not present

## 2021-08-05 DIAGNOSIS — N63 Unspecified lump in unspecified breast: Secondary | ICD-10-CM | POA: Diagnosis not present

## 2021-08-05 DIAGNOSIS — I10 Essential (primary) hypertension: Secondary | ICD-10-CM | POA: Diagnosis not present

## 2021-08-05 DIAGNOSIS — M1A9XX1 Chronic gout, unspecified, with tophus (tophi): Secondary | ICD-10-CM | POA: Diagnosis not present

## 2021-08-31 DIAGNOSIS — Z0000 Encounter for general adult medical examination without abnormal findings: Secondary | ICD-10-CM | POA: Diagnosis not present

## 2021-08-31 DIAGNOSIS — Z13220 Encounter for screening for lipoid disorders: Secondary | ICD-10-CM | POA: Diagnosis not present

## 2021-08-31 DIAGNOSIS — Z23 Encounter for immunization: Secondary | ICD-10-CM | POA: Diagnosis not present

## 2021-09-14 DIAGNOSIS — N63 Unspecified lump in unspecified breast: Secondary | ICD-10-CM | POA: Diagnosis not present

## 2021-09-14 DIAGNOSIS — I10 Essential (primary) hypertension: Secondary | ICD-10-CM | POA: Diagnosis not present

## 2021-10-07 ENCOUNTER — Other Ambulatory Visit: Payer: Self-pay | Admitting: Physician Assistant

## 2021-10-07 DIAGNOSIS — N6310 Unspecified lump in the right breast, unspecified quadrant: Secondary | ICD-10-CM

## 2021-10-18 ENCOUNTER — Ambulatory Visit
Admission: RE | Admit: 2021-10-18 | Discharge: 2021-10-18 | Disposition: A | Discharge status: Home / Self Care | Payer: BLUE CROSS/BLUE SHIELD | Source: Ambulatory Visit | Attending: Physician Assistant | Admitting: Physician Assistant

## 2021-10-18 ENCOUNTER — Ambulatory Visit
Admission: RE | Admit: 2021-10-18 | Discharge: 2021-10-18 | Disposition: A | Discharge status: Home / Self Care | Payer: BC Managed Care – PPO | Source: Ambulatory Visit | Attending: Physician Assistant | Admitting: Physician Assistant

## 2021-10-18 DIAGNOSIS — N6310 Unspecified lump in the right breast, unspecified quadrant: Secondary | ICD-10-CM | POA: Diagnosis not present

## 2021-10-18 DIAGNOSIS — R928 Other abnormal and inconclusive findings on diagnostic imaging of breast: Secondary | ICD-10-CM | POA: Diagnosis not present

## 2022-01-21 DIAGNOSIS — Z79899 Other long term (current) drug therapy: Secondary | ICD-10-CM | POA: Diagnosis not present

## 2022-01-21 DIAGNOSIS — M1A09X1 Idiopathic chronic gout, multiple sites, with tophus (tophi): Secondary | ICD-10-CM | POA: Diagnosis not present

## 2022-01-21 DIAGNOSIS — Z791 Long term (current) use of non-steroidal anti-inflammatories (NSAID): Secondary | ICD-10-CM | POA: Diagnosis not present

## 2022-01-21 DIAGNOSIS — Z5181 Encounter for therapeutic drug level monitoring: Secondary | ICD-10-CM | POA: Diagnosis not present

## 2022-07-28 DIAGNOSIS — M170 Bilateral primary osteoarthritis of knee: Secondary | ICD-10-CM | POA: Diagnosis not present

## 2022-07-28 DIAGNOSIS — M25461 Effusion, right knee: Secondary | ICD-10-CM | POA: Diagnosis not present

## 2022-07-28 DIAGNOSIS — Z5181 Encounter for therapeutic drug level monitoring: Secondary | ICD-10-CM | POA: Diagnosis not present

## 2022-07-28 DIAGNOSIS — M1990 Unspecified osteoarthritis, unspecified site: Secondary | ICD-10-CM | POA: Diagnosis not present

## 2022-07-28 DIAGNOSIS — M1711 Unilateral primary osteoarthritis, right knee: Secondary | ICD-10-CM | POA: Diagnosis not present

## 2022-07-28 DIAGNOSIS — I10 Essential (primary) hypertension: Secondary | ICD-10-CM | POA: Diagnosis not present

## 2022-07-28 DIAGNOSIS — Z903 Acquired absence of stomach [part of]: Secondary | ICD-10-CM | POA: Diagnosis not present

## 2022-07-28 DIAGNOSIS — Z791 Long term (current) use of non-steroidal anti-inflammatories (NSAID): Secondary | ICD-10-CM | POA: Diagnosis not present

## 2022-07-28 DIAGNOSIS — M1712 Unilateral primary osteoarthritis, left knee: Secondary | ICD-10-CM | POA: Diagnosis not present

## 2022-07-28 DIAGNOSIS — M1A09X1 Idiopathic chronic gout, multiple sites, with tophus (tophi): Secondary | ICD-10-CM | POA: Diagnosis not present

## 2022-07-28 DIAGNOSIS — Z79899 Other long term (current) drug therapy: Secondary | ICD-10-CM | POA: Diagnosis not present

## 2022-07-28 DIAGNOSIS — M12842 Other specific arthropathies, not elsewhere classified, left hand: Secondary | ICD-10-CM | POA: Diagnosis not present

## 2022-07-28 DIAGNOSIS — Z6841 Body Mass Index (BMI) 40.0 and over, adult: Secondary | ICD-10-CM | POA: Diagnosis not present

## 2022-09-05 DIAGNOSIS — M1A9XX1 Chronic gout, unspecified, with tophus (tophi): Secondary | ICD-10-CM | POA: Diagnosis not present

## 2022-09-05 DIAGNOSIS — Z0000 Encounter for general adult medical examination without abnormal findings: Secondary | ICD-10-CM | POA: Diagnosis not present

## 2022-09-05 DIAGNOSIS — I10 Essential (primary) hypertension: Secondary | ICD-10-CM | POA: Diagnosis not present

## 2022-09-05 DIAGNOSIS — Z13220 Encounter for screening for lipoid disorders: Secondary | ICD-10-CM | POA: Diagnosis not present

## 2023-01-24 DIAGNOSIS — Z79899 Other long term (current) drug therapy: Secondary | ICD-10-CM | POA: Diagnosis not present

## 2023-01-24 DIAGNOSIS — I10 Essential (primary) hypertension: Secondary | ICD-10-CM | POA: Diagnosis not present

## 2023-01-24 DIAGNOSIS — M1A09X1 Idiopathic chronic gout, multiple sites, with tophus (tophi): Secondary | ICD-10-CM | POA: Diagnosis not present

## 2023-01-24 DIAGNOSIS — Z5181 Encounter for therapeutic drug level monitoring: Secondary | ICD-10-CM | POA: Diagnosis not present

## 2023-02-03 DIAGNOSIS — Z6828 Body mass index (BMI) 28.0-28.9, adult: Secondary | ICD-10-CM | POA: Diagnosis not present

## 2023-02-03 DIAGNOSIS — U071 COVID-19: Secondary | ICD-10-CM | POA: Diagnosis not present

## 2023-02-03 DIAGNOSIS — M791 Myalgia, unspecified site: Secondary | ICD-10-CM | POA: Diagnosis not present

## 2023-02-03 DIAGNOSIS — I10 Essential (primary) hypertension: Secondary | ICD-10-CM | POA: Diagnosis not present

## 2023-02-10 DIAGNOSIS — M545 Low back pain, unspecified: Secondary | ICD-10-CM | POA: Diagnosis not present

## 2023-06-01 DIAGNOSIS — L989 Disorder of the skin and subcutaneous tissue, unspecified: Secondary | ICD-10-CM | POA: Diagnosis not present

## 2023-06-01 DIAGNOSIS — E7800 Pure hypercholesterolemia, unspecified: Secondary | ICD-10-CM | POA: Diagnosis not present

## 2023-06-01 DIAGNOSIS — M1A09X1 Idiopathic chronic gout, multiple sites, with tophus (tophi): Secondary | ICD-10-CM | POA: Diagnosis not present

## 2023-06-01 DIAGNOSIS — Z79899 Other long term (current) drug therapy: Secondary | ICD-10-CM | POA: Diagnosis not present

## 2023-06-01 DIAGNOSIS — Z5181 Encounter for therapeutic drug level monitoring: Secondary | ICD-10-CM | POA: Diagnosis not present

## 2023-06-02 DIAGNOSIS — Z79899 Other long term (current) drug therapy: Secondary | ICD-10-CM | POA: Diagnosis not present

## 2023-06-02 DIAGNOSIS — M1A09X1 Idiopathic chronic gout, multiple sites, with tophus (tophi): Secondary | ICD-10-CM | POA: Diagnosis not present

## 2023-06-02 DIAGNOSIS — E7800 Pure hypercholesterolemia, unspecified: Secondary | ICD-10-CM | POA: Diagnosis not present

## 2023-06-02 DIAGNOSIS — Z5181 Encounter for therapeutic drug level monitoring: Secondary | ICD-10-CM | POA: Diagnosis not present

## 2023-09-19 DIAGNOSIS — Z23 Encounter for immunization: Secondary | ICD-10-CM | POA: Diagnosis not present

## 2023-09-19 DIAGNOSIS — I10 Essential (primary) hypertension: Secondary | ICD-10-CM | POA: Diagnosis not present

## 2023-09-19 DIAGNOSIS — Z0000 Encounter for general adult medical examination without abnormal findings: Secondary | ICD-10-CM | POA: Diagnosis not present

## 2023-09-19 DIAGNOSIS — D229 Melanocytic nevi, unspecified: Secondary | ICD-10-CM | POA: Diagnosis not present

## 2023-09-19 DIAGNOSIS — M1A9XX1 Chronic gout, unspecified, with tophus (tophi): Secondary | ICD-10-CM | POA: Diagnosis not present

## 2023-09-19 DIAGNOSIS — R5383 Other fatigue: Secondary | ICD-10-CM | POA: Diagnosis not present

## 2023-12-05 DIAGNOSIS — Z79899 Other long term (current) drug therapy: Secondary | ICD-10-CM | POA: Diagnosis not present

## 2023-12-05 DIAGNOSIS — M1A09X1 Idiopathic chronic gout, multiple sites, with tophus (tophi): Secondary | ICD-10-CM | POA: Diagnosis not present

## 2023-12-05 DIAGNOSIS — Z5181 Encounter for therapeutic drug level monitoring: Secondary | ICD-10-CM | POA: Diagnosis not present
